# Patient Record
Sex: Female | Born: 1965
Health system: Southern US, Community
[De-identification: ages and names within clinical notes are randomized; demographics above are authoritative.]

## PROBLEM LIST (undated history)

## (undated) DIAGNOSIS — E785 Hyperlipidemia, unspecified: Secondary | ICD-10-CM

## (undated) DIAGNOSIS — R51 Headache: Secondary | ICD-10-CM

## (undated) DIAGNOSIS — E559 Vitamin D deficiency, unspecified: Secondary | ICD-10-CM

## (undated) DIAGNOSIS — E2839 Other primary ovarian failure: Secondary | ICD-10-CM

## (undated) DIAGNOSIS — E039 Hypothyroidism, unspecified: Secondary | ICD-10-CM

## (undated) HISTORY — PX: WISDOM TOOTH EXTRACTION: SHX21

---
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
## 1998-09-28 ENCOUNTER — Other Ambulatory Visit: Admission: RE | Admit: 1998-09-28 | Discharge: 1998-09-28 | Payer: Self-pay | Admitting: Gynecology

## 1999-10-02 ENCOUNTER — Other Ambulatory Visit: Admission: RE | Admit: 1999-10-02 | Discharge: 1999-10-02 | Payer: Self-pay | Admitting: Gynecology

## 2000-05-24 ENCOUNTER — Encounter: Payer: Self-pay | Admitting: Gynecology

## 2000-05-24 ENCOUNTER — Encounter: Admission: RE | Admit: 2000-05-24 | Discharge: 2000-05-24 | Payer: Self-pay | Admitting: Gynecology

## 2000-10-02 ENCOUNTER — Other Ambulatory Visit: Admission: RE | Admit: 2000-10-02 | Discharge: 2000-10-02 | Payer: Self-pay | Admitting: Gynecology

## 2001-09-30 ENCOUNTER — Other Ambulatory Visit: Admission: RE | Admit: 2001-09-30 | Discharge: 2001-09-30 | Payer: Self-pay | Admitting: Gynecology

## 2002-11-04 ENCOUNTER — Other Ambulatory Visit: Admission: RE | Admit: 2002-11-04 | Discharge: 2002-11-04 | Payer: Self-pay | Admitting: Gynecology

## 2003-11-04 ENCOUNTER — Encounter: Admission: RE | Admit: 2003-11-04 | Discharge: 2003-11-04 | Payer: Self-pay | Admitting: Gynecology

## 2003-11-04 ENCOUNTER — Other Ambulatory Visit: Admission: RE | Admit: 2003-11-04 | Discharge: 2003-11-04 | Payer: Self-pay | Admitting: Gynecology

## 2004-11-06 ENCOUNTER — Other Ambulatory Visit: Admission: RE | Admit: 2004-11-06 | Discharge: 2004-11-06 | Payer: Self-pay | Admitting: Gynecology

## 2004-11-27 ENCOUNTER — Encounter: Admission: RE | Admit: 2004-11-27 | Discharge: 2004-11-27 | Payer: Self-pay | Admitting: Gynecology

## 2005-11-13 ENCOUNTER — Other Ambulatory Visit: Admission: RE | Admit: 2005-11-13 | Discharge: 2005-11-13 | Payer: Self-pay | Admitting: Gynecology

## 2005-11-29 ENCOUNTER — Encounter: Admission: RE | Admit: 2005-11-29 | Discharge: 2005-11-29 | Payer: Self-pay | Admitting: Gynecology

## 2006-11-14 ENCOUNTER — Other Ambulatory Visit: Admission: RE | Admit: 2006-11-14 | Discharge: 2006-11-14 | Payer: Self-pay | Admitting: Gynecology

## 2006-12-02 ENCOUNTER — Encounter: Admission: RE | Admit: 2006-12-02 | Discharge: 2006-12-02 | Payer: Self-pay | Admitting: Gynecology

## 2007-07-28 ENCOUNTER — Emergency Department (HOSPITAL_COMMUNITY): Admission: EM | Admit: 2007-07-28 | Discharge: 2007-07-28 | Payer: Self-pay | Admitting: Emergency Medicine

## 2007-11-17 ENCOUNTER — Other Ambulatory Visit: Admission: RE | Admit: 2007-11-17 | Discharge: 2007-11-17 | Payer: Self-pay | Admitting: Gynecology

## 2007-12-05 ENCOUNTER — Encounter: Admission: RE | Admit: 2007-12-05 | Discharge: 2007-12-05 | Payer: Self-pay | Admitting: Gynecology

## 2008-08-11 ENCOUNTER — Encounter (INDEPENDENT_AMBULATORY_CARE_PROVIDER_SITE_OTHER): Payer: Self-pay | Admitting: Interventional Radiology

## 2008-08-11 ENCOUNTER — Encounter: Admission: RE | Admit: 2008-08-11 | Discharge: 2008-08-11 | Payer: Self-pay | Admitting: Internal Medicine

## 2008-08-11 ENCOUNTER — Other Ambulatory Visit: Admission: RE | Admit: 2008-08-11 | Discharge: 2008-08-11 | Payer: Self-pay | Admitting: Interventional Radiology

## 2010-09-17 ENCOUNTER — Encounter: Payer: Self-pay | Admitting: Physical Medicine and Rehabilitation

## 2010-09-27 HISTORY — PX: BREAST SURGERY: SHX581

## 2010-11-01 ENCOUNTER — Other Ambulatory Visit: Payer: Self-pay | Admitting: Plastic Surgery

## 2010-11-01 ENCOUNTER — Ambulatory Visit
Admission: RE | Admit: 2010-11-01 | Discharge: 2010-11-01 | Disposition: A | Discharge status: Home / Self Care | Payer: PRIVATE HEALTH INSURANCE | Source: Ambulatory Visit | Attending: Plastic Surgery | Admitting: Plastic Surgery

## 2010-11-01 DIAGNOSIS — T148XXA Other injury of unspecified body region, initial encounter: Secondary | ICD-10-CM

## 2011-06-04 LAB — DIFFERENTIAL
Basophils Absolute: 0
Basophils Relative: 0
Eosinophils Absolute: 0.1 — ABNORMAL LOW
Eosinophils Relative: 1
Lymphocytes Relative: 31
Lymphs Abs: 2.3
Monocytes Absolute: 0.7
Monocytes Relative: 9
Neutro Abs: 4.2
Neutrophils Relative %: 58

## 2011-06-04 LAB — CBC
HCT: 34.6 — ABNORMAL LOW
Hemoglobin: 12
MCHC: 34.8
MCV: 89.2
Platelets: 231
RBC: 3.88
RDW: 14
WBC: 7.3

## 2011-06-04 LAB — COMPREHENSIVE METABOLIC PANEL
ALT: 22
AST: 28
Albumin: 3.9
Alkaline Phosphatase: 63
BUN: 13
CO2: 27
Calcium: 9.1
Chloride: 98
Creatinine, Ser: 0.71
GFR calc Af Amer: >60
GFR calc non Af Amer: >60
Glucose, Bld: 116 — ABNORMAL HIGH
Potassium: 3.6
Sodium: 134 — ABNORMAL LOW
Total Bilirubin: 0.9
Total Protein: 6.5

## 2011-06-04 LAB — URINE MICROSCOPIC-ADD ON

## 2011-06-04 LAB — LIPASE, BLOOD: Lipase: 24

## 2011-06-04 LAB — URINALYSIS, ROUTINE W REFLEX MICROSCOPIC
Bilirubin Urine: NEGATIVE
Nitrite: NEGATIVE
Specific Gravity, Urine: 1.015
Urobilinogen, UA: 0.2

## 2012-01-17 ENCOUNTER — Ambulatory Visit (HOSPITAL_COMMUNITY): Payer: PRIVATE HEALTH INSURANCE | Admitting: Anesthesiology

## 2012-01-17 ENCOUNTER — Encounter (HOSPITAL_COMMUNITY): Payer: Self-pay | Admitting: *Deleted

## 2012-01-17 ENCOUNTER — Encounter (HOSPITAL_COMMUNITY)
Admission: RE | Disposition: A | Discharge status: Home / Self Care | Payer: Self-pay | Source: Ambulatory Visit | Attending: Obstetrics and Gynecology

## 2012-01-17 ENCOUNTER — Other Ambulatory Visit: Payer: Self-pay | Admitting: Obstetrics and Gynecology

## 2012-01-17 ENCOUNTER — Encounter (HOSPITAL_COMMUNITY): Payer: Self-pay | Admitting: Anesthesiology

## 2012-01-17 ENCOUNTER — Encounter (HOSPITAL_COMMUNITY): Payer: Self-pay | Admitting: Pharmacist

## 2012-01-17 ENCOUNTER — Ambulatory Visit (HOSPITAL_COMMUNITY)
Admission: RE | Admit: 2012-01-17 | Discharge: 2012-01-17 | Disposition: A | Discharge status: Home / Self Care | Payer: PRIVATE HEALTH INSURANCE | Source: Ambulatory Visit | Attending: Obstetrics and Gynecology | Admitting: Obstetrics and Gynecology

## 2012-01-17 DIAGNOSIS — N764 Abscess of vulva: Secondary | ICD-10-CM | POA: Insufficient documentation

## 2012-01-17 HISTORY — DX: Hyperlipidemia, unspecified: E78.5

## 2012-01-17 HISTORY — DX: Hypothyroidism, unspecified: E03.9

## 2012-01-17 HISTORY — PX: VULVAR LESION REMOVAL: SHX5391

## 2012-01-17 HISTORY — DX: Headache: R51

## 2012-01-17 LAB — CBC
Hemoglobin: 13.7 g/dL (ref 12.0–15.0)
MCH: 31.6 pg (ref 26.0–34.0)
MCHC: 34.7 g/dL (ref 30.0–36.0)
RDW: 13.9 % (ref 11.5–15.5)

## 2012-01-17 SURGERY — VULVAR LESION
Anesthesia: General | Site: Perineum | Wound class: Dirty or Infected

## 2012-01-17 MED ORDER — PROMETHAZINE HCL 25 MG/ML IJ SOLN: 6.2500 mg | INTRAMUSCULAR | Status: DC | PRN

## 2012-01-17 MED ORDER — DEXAMETHASONE SODIUM PHOSPHATE 4 MG/ML IJ SOLN
INTRAMUSCULAR | Status: DC | PRN
  Administered 2012-01-17: 10 mg via INTRAVENOUS

## 2012-01-17 MED ORDER — ONDANSETRON HCL 4 MG/2ML IJ SOLN
INTRAMUSCULAR | Status: AC
  Filled 2012-01-17: qty 2

## 2012-01-17 MED ORDER — LIDOCAINE HCL (CARDIAC) 20 MG/ML IV SOLN
INTRAVENOUS | Status: DC | PRN
  Administered 2012-01-17: 100 mg via INTRAVENOUS

## 2012-01-17 MED ORDER — MIDAZOLAM HCL 2 MG/2ML IJ SOLN
INTRAMUSCULAR | Status: AC
  Filled 2012-01-17: qty 2

## 2012-01-17 MED ORDER — KETOROLAC TROMETHAMINE 30 MG/ML IJ SOLN
INTRAMUSCULAR | Status: DC | PRN
  Administered 2012-01-17: 30 mg via INTRAVENOUS

## 2012-01-17 MED ORDER — MIDAZOLAM HCL 5 MG/5ML IJ SOLN
INTRAMUSCULAR | Status: DC | PRN
  Administered 2012-01-17: 2 mg via INTRAVENOUS

## 2012-01-17 MED ORDER — SULFAMETHOXAZOLE-TRIMETHOPRIM 800-160 MG PO TABS: 1.0000 | ORAL_TABLET | Freq: Two times a day (BID) | ORAL | Status: AC

## 2012-01-17 MED ORDER — PROPOFOL 10 MG/ML IV EMUL
INTRAVENOUS | Status: DC | PRN
  Administered 2012-01-17: 200 mg via INTRAVENOUS

## 2012-01-17 MED ORDER — LACTATED RINGERS IV SOLN
INTRAVENOUS | Status: DC
  Administered 2012-01-17: 1000 mL via INTRAVENOUS

## 2012-01-17 MED ORDER — PROPOFOL 10 MG/ML IV EMUL
INTRAVENOUS | Status: AC
  Filled 2012-01-17: qty 20

## 2012-01-17 MED ORDER — KETOROLAC TROMETHAMINE 30 MG/ML IJ SOLN
15.0000 mg | Freq: Once | INTRAMUSCULAR | Status: DC | PRN
Start: 2012-01-17 — End: 2012-01-17

## 2012-01-17 MED ORDER — LIDOCAINE HCL (CARDIAC) 20 MG/ML IV SOLN
INTRAVENOUS | Status: AC
  Filled 2012-01-17: qty 5

## 2012-01-17 MED ORDER — 0.9 % SODIUM CHLORIDE (POUR BTL) OPTIME
TOPICAL | Status: DC | PRN
  Administered 2012-01-17: 1000 mL

## 2012-01-17 MED ORDER — FENTANYL CITRATE 0.05 MG/ML IJ SOLN
INTRAMUSCULAR | Status: DC | PRN
  Administered 2012-01-17: 100 ug via INTRAVENOUS

## 2012-01-17 MED ORDER — OXYCODONE-ACETAMINOPHEN 5-325 MG PO TABS: 2.0000 | ORAL_TABLET | Freq: Four times a day (QID) | ORAL | Status: AC | PRN

## 2012-01-17 MED ORDER — CLINDAMYCIN PHOSPHATE 900 MG/50ML IV SOLN
900.0000 mg | Freq: Once | INTRAVENOUS | Status: AC
  Administered 2012-01-17: 900 mg via INTRAVENOUS
  Filled 2012-01-17: qty 50

## 2012-01-17 MED ORDER — ONDANSETRON HCL 4 MG/2ML IJ SOLN
INTRAMUSCULAR | Status: DC | PRN
  Administered 2012-01-17: 4 mg via INTRAVENOUS

## 2012-01-17 MED ORDER — KETOROLAC TROMETHAMINE 30 MG/ML IJ SOLN
INTRAMUSCULAR | Status: AC
  Filled 2012-01-17: qty 1

## 2012-01-17 MED ORDER — MEPERIDINE HCL 25 MG/ML IJ SOLN: 6.2500 mg | INTRAMUSCULAR | Status: DC | PRN

## 2012-01-17 MED ORDER — FENTANYL CITRATE 0.05 MG/ML IJ SOLN
INTRAMUSCULAR | Status: AC
  Filled 2012-01-17: qty 2

## 2012-01-17 MED ORDER — FENTANYL CITRATE 0.05 MG/ML IJ SOLN: 25.0000 ug | INTRAMUSCULAR | Status: DC | PRN

## 2012-01-17 SURGICAL SUPPLY — 28 items
APPLICATOR COTTON TIP 6IN STRL (MISCELLANEOUS) ×1 IMPLANT
BLADE SURG 15 STRL LF C SS BP (BLADE) ×1 IMPLANT
BLADE SURG 15 STRL SS (BLADE) ×2
CLOTH BEACON ORANGE TIMEOUT ST (SAFETY) ×2 IMPLANT
CONTAINER PREFILL 10% NBF 15ML (MISCELLANEOUS) ×2 IMPLANT
COUNTER NEEDLE 1200 MAGNETIC (NEEDLE) IMPLANT
EVACUATOR PREFILTER SMOKE (MISCELLANEOUS) IMPLANT
GAUZE PACKING IODOFORM 1/2 (PACKING) ×2 IMPLANT
GLOVE BIO SURGEON STRL SZ8 (GLOVE) ×4 IMPLANT
GLOVE INDICATOR 7.0 STRL GRN (GLOVE) ×2 IMPLANT
GOWN PREVENTION PLUS LG XLONG (DISPOSABLE) ×2 IMPLANT
HOSE NS SMOKE EVAC 7/8 X6 (MISCELLANEOUS) IMPLANT
NDL SPNL 22GX3.5 QUINCKE BK (NEEDLE) ×1 IMPLANT
NEEDLE SPNL 22GX3.5 QUINCKE BK (NEEDLE) ×2 IMPLANT
NS IRRIG 1000ML POUR BTL (IV SOLUTION) ×2 IMPLANT
PACK VAGINAL MINOR WOMEN LF (CUSTOM PROCEDURE TRAY) ×2 IMPLANT
PAD PREP 24X48 CUFFED NSTRL (MISCELLANEOUS) ×2 IMPLANT
REDUCER FITTING SMOKE EVAC (MISCELLANEOUS) IMPLANT
SCOPETTES 8  STERILE (MISCELLANEOUS) ×3
SCOPETTES 8 STERILE (MISCELLANEOUS) ×3 IMPLANT
SUT VIC AB 3-0 PS2 18 (SUTURE)
SUT VIC AB 3-0 PS2 18XBRD (SUTURE) IMPLANT
SYR BULB IRRIGATION 50ML (SYRINGE) ×1 IMPLANT
SYR CONTROL 10ML LL (SYRINGE) ×2 IMPLANT
TOWEL OR 17X24 6PK STRL BLUE (TOWEL DISPOSABLE) ×4 IMPLANT
TUBING CONNECTING 10 (TUBING) ×1 IMPLANT
WATER STERILE IRR 1000ML POUR (IV SOLUTION) ×2 IMPLANT
YANKAUER SUCT BULB TIP NO VENT (SUCTIONS) ×1 IMPLANT

## 2012-01-17 NOTE — Progress Notes (Signed)
Reviewed with patient / husband procedure Risks including recurrence, , pelvic pain, dyspareunia, colon involvement All questions answered.

## 2012-01-17 NOTE — Anesthesia Postprocedure Evaluation (Signed)
Anesthesia Post Note  Patient: Kathleen Luna  Procedure(s) Performed: Procedure(s) (LRB): VULVAR LESION (N/A)  Anesthesia type: General  Patient location: PACU  Post pain: Pain level controlled  Post assessment: Post-op Vital signs reviewed  Last Vitals:  Filed Vitals:   01/17/12 1700  BP: 124/79  Pulse: 86  Temp: 37.1 C  Resp: 26    Post vital signs: Reviewed  Level of consciousness: sedated  Complications: No apparent anesthesia complicationsfj

## 2012-01-17 NOTE — Transfer of Care (Signed)
Immediate Anesthesia Transfer of Care Note  Patient: Kathleen Luna  Procedure(s) Performed: Procedure(s) (LRB): VULVAR LESION (N/A)  Patient Location: PACU  Anesthesia Type: General  Level of Consciousness: awake, alert  and oriented  Airway & Oxygen Therapy: Patient Spontanous Breathing  Post-op Assessment: Report given to PACU RN  Post vital signs: Reviewed and stable  Complications: No apparent anesthesia complications

## 2012-01-17 NOTE — Anesthesia Procedure Notes (Signed)
Procedure Name: LMA Insertion Date/Time: 01/17/2012 4:17 PM Performed by: Babatunde Seago, Jannet Askew Pre-anesthesia Checklist: Patient identified, Patient being monitored, Emergency Drugs available and Timeout performed Patient Re-evaluated:Patient Re-evaluated prior to inductionOxygen Delivery Method: Circle system utilized Preoxygenation: Pre-oxygenation with 100% oxygen Intubation Type: IV induction LMA: LMA inserted LMA Size: 4.0 Grade View: Grade I Number of attempts: 1

## 2012-01-17 NOTE — Anesthesia Preprocedure Evaluation (Signed)
Anesthesia Evaluation  Patient identified by MRN, date of birth, ID band Patient awake    Reviewed: Allergy & Precautions, H&P , NPO status , Patient's Chart, lab work & pertinent test results  Airway Mallampati: I TM Distance: >3 FB Neck ROM: full    Dental No notable dental hx. (+) Teeth Intact   Pulmonary neg pulmonary ROS,    Pulmonary exam normal       Cardiovascular negative cardio ROS      Neuro/Psych negative psych ROS   GI/Hepatic negative GI ROS, Neg liver ROS,   Endo/Other    Renal/GU negative Renal ROS  negative genitourinary   Musculoskeletal negative musculoskeletal ROS (+)   Abdominal Normal abdominal exam  (+)   Peds negative pediatric ROS (+)  Hematology negative hematology ROS (+)   Anesthesia Other Findings   Reproductive/Obstetrics negative OB ROS                           Anesthesia Physical Anesthesia Plan  ASA: II  Anesthesia Plan: General   Post-op Pain Management:    Induction: Intravenous  Airway Management Planned: LMA  Additional Equipment:   Intra-op Plan:   Post-operative Plan:   Informed Consent: I have reviewed the patients History and Physical, chart, labs and discussed the procedure including the risks, benefits and alternatives for the proposed anesthesia with the patient or authorized representative who has indicated his/her understanding and acceptance.     Plan Discussed with: CRNA and Surgeon  Anesthesia Plan Comments:         Anesthesia Quick Evaluation

## 2012-01-17 NOTE — H&P (Signed)
Kathleen Luna is an 46 y.o. female with abcess of vulva noted in office yesterday. Patient placed on Keflex.  Today has more pain. Unable to drain in office due to patient discomfort.  Patient reports low grade temperature elevations. Pertinent Gynecological History: Menses: amenorrheic on Mirena Bleeding: none Contraception: IUD DES exposure: unknown Blood transfusions: none Sexually transmitted diseases: HSV Previous GYN Procedures: none  Last mammogram: normal Date: 2013 Last pap: normal Date: 2013 OB History: G1, P1   Menstrual History: Menarche age: unknown No LMP recorded. Patient is not currently having periods (Reason: IUD).    Past Medical History  Diagnosis Date  . SVD (spontaneous vaginal delivery)     x 1  . Hyperlipidemia   . Hypothyroidism   . Headache     otc med prn    Past Surgical History  Procedure Date  . Breast surgery 09/2010    augmentation  . Wisdom tooth extraction     History reviewed. No pertinent family history.  Social History:  reports that she quit smoking about 14 years ago. Her smoking use included Cigarettes. She has a 7.5 pack-year smoking history. She has never used smokeless tobacco. She reports that she drinks alcohol. She reports that she does not use illicit drugs.  Allergies: No Known Allergies  No prescriptions prior to admission    ROS  Height 5\' 4"  (1.626 m), weight 70.308 kg (155 lb). Physical Exam  Cardiovascular: Normal rate and regular rhythm.   Respiratory: Effort normal and breath sounds normal.  Genitourinary:       Fluctuant abscess of vulva over upper perineal body    No results found for this or any previous visit (from the past 24 hour(s)).  No results found.  Assessment/Plan: 46 yo G1P1 with vulvar abcess for I&D.  Brittay Mogle II,Jame Morrell E 01/17/2012, 12:50 PM

## 2012-01-17 NOTE — Brief Op Note (Signed)
01/17/2012  4:42 PM  PATIENT:  Bolivar Haw  46 y.o. female  PRE-OPERATIVE DIAGNOSIS:  perineum abscess  POST-OPERATIVE DIAGNOSIS:  perineal abcess  PROCEDURE:  Procedure(s) (LRB):Incision and Drainage of VULVAR LESION (N/A)  SURGEON:  Surgeon(s) and Role:    * Leslie Andrea, MD - Primary  PHYSICIAN ASSISTANT:   ASSISTANTS: none   ANESTHESIA:   general  EBL:  Total I/O In: 1000 [I.V.:1000] Out: 50 [Urine:50]  BLOOD ADMINISTERED:none  DRAINS: none   LOCAL MEDICATIONS USED:  NONE  SPECIMEN:  Source of Specimen:  aerobic and anaerobic cultures  DISPOSITION OF SPECIMEN:  PATHOLOGY  COUNTS:  YES  TOURNIQUET:  * No tourniquets in log *  DICTATION: .Other Dictation: Dictation Number [id number]  PLAN OF CARE: Discharge to home after PACU  PATIENT DISPOSITION:  PACU - hemodynamically stable.   Delay start of Pharmacological VTE agent (>24hrs) due to surgical blood loss or risk of bleeding: not applicable

## 2012-01-18 NOTE — Op Note (Signed)
NAMESRITHA, CHAUNCEY                ACCOUNT NO.:  [id number]  MEDICAL RECORD NO.:  [id number]  LOCATION:  [facility]                          FACILITY:  [facility]  PHYSICIAN:  Guy Sandifer. Henderson Cloud, M.D. DATE OF BIRTH:  1966/06/19  DATE OF PROCEDURE:  01/17/2012 DATE OF DISCHARGE:  01/17/2012                              OPERATIVE REPORT   PREOPERATIVE DIAGNOSIS:  Vulvar abscess.  POSTOPERATIVE DIAGNOSIS:  Vulvar abscess.  PROCEDURE:  Incision and drainage of vulvar abscess.  SURGEON:  Guy Sandifer. Henderson Cloud, M.D.  ANESTHESIA:  General with LMA, Leilani Able, MD  ESTIMATED BLOOD LOSS:  Drops.  SPECIMENS:  Aerobic and anaerobic cultures to pathology.  INDICATIONS AND CONSENT:  This patient is a 46 year old married white female, with a Mirena IUD in place, who presents to the office yesterday with a small abscess over the perineal body.  She is placed on Ceftin. She comes back today with complaints of some body aches, but no fever. On exam, she has a very fluctuant abscess over the perineal body. Incision and drainage is recommended.  During the period of time from the office visit until her presentation to the operating room, the lesion began to drain spontaneously.  Potential risks and complications were discussed with the patient and her husband preoperatively including not limited to, further spreading, infection, recurrence of the abscess, fistula formation, pelvic pain, pain with intercourse, DVT, PE, pneumonia and organ damage.  All questions were answered and consent is signed on the chart.  FINDINGS:  There is a deflated abscess at the 6 o'clock position extending from the vaginal introitus down to the anus.  It appears to have drained just at the margin of the anus.  On examination both rectal and vaginal, there is induration in a butterfly pattern bilaterally that does not appear to extend beyond the perineum.  PROCEDURE:  The patient was taken to the operating room, where she  was identified, placed in dorsal supine position and general anesthesia was induced via LMA.  She was placed in the dorsal lithotomy position.  Time- out was undertaken.  She was prepped with Betadine.  Bladder straight catheterized and she was draped in a sterile fashion.  After noting the above, the incision is made on the perineum in the center of the abscess capsule.  Very little purulent material was noted.  Cultures were taken both aerobic and anaerobic.  The area was explored bilaterally thoroughly and no additional purulence was noted.  A 1-1/2 inch iodoform gauze packing was placed in the incision.  All counts were correct.  The patient taken to the recovery room in stable condition.     Guy Sandifer Henderson Cloud, M.D.     JET/MEDQ  D:  01/17/2012  T:  01/18/2012  Job:  [id number]

## 2012-01-20 LAB — CULTURE, ROUTINE-ABSCESS

## 2012-01-24 LAB — ANAEROBIC CULTURE

## 2012-03-21 ENCOUNTER — Encounter (HOSPITAL_COMMUNITY): Payer: Self-pay | Admitting: Anesthesiology

## 2012-03-21 ENCOUNTER — Encounter (HOSPITAL_COMMUNITY)
Admission: AD | Disposition: A | Discharge status: Home / Self Care | Payer: Self-pay | Source: Ambulatory Visit | Attending: Obstetrics and Gynecology

## 2012-03-21 ENCOUNTER — Encounter (HOSPITAL_COMMUNITY): Payer: Self-pay | Admitting: *Deleted

## 2012-03-21 ENCOUNTER — Inpatient Hospital Stay (HOSPITAL_COMMUNITY)
Admission: AD | Admit: 2012-03-21 | Discharge: 2012-03-21 | Disposition: A | Discharge status: Home / Self Care | Payer: No Typology Code available for payment source | Source: Ambulatory Visit | Attending: Obstetrics and Gynecology | Admitting: Obstetrics and Gynecology

## 2012-03-21 ENCOUNTER — Encounter (HOSPITAL_COMMUNITY): Admission: AD | Payer: Self-pay | Source: Ambulatory Visit

## 2012-03-21 ENCOUNTER — Ambulatory Visit (HOSPITAL_COMMUNITY)
Admission: AD | Admit: 2012-03-21 | Payer: PRIVATE HEALTH INSURANCE | Source: Ambulatory Visit | Admitting: Obstetrics and Gynecology

## 2012-03-21 DIAGNOSIS — N764 Abscess of vulva: Secondary | ICD-10-CM | POA: Insufficient documentation

## 2012-03-21 LAB — URINALYSIS, ROUTINE W REFLEX MICROSCOPIC
Bilirubin Urine: NEGATIVE
Glucose, UA: NEGATIVE mg/dL
Ketones, ur: NEGATIVE mg/dL
Leukocytes, UA: NEGATIVE
pH: 6.5 (ref 5.0–8.0)

## 2012-03-21 LAB — URINE MICROSCOPIC-ADD ON

## 2012-03-21 SURGERY — VULVAR LESION
Anesthesia: Choice

## 2012-03-21 MED ORDER — OXYCODONE-ACETAMINOPHEN 5-325 MG PO TABS: 2.0000 | ORAL_TABLET | Freq: Four times a day (QID) | ORAL | Status: AC | PRN

## 2012-03-21 MED ORDER — CIPROFLOXACIN HCL 500 MG PO TABS: 500.0000 mg | ORAL_TABLET | Freq: Two times a day (BID) | ORAL | Status: AC

## 2012-03-21 SURGICAL SUPPLY — 22 items
BLADE SURG 15 STRL LF C SS BP (BLADE) ×1 IMPLANT
BLADE SURG 15 STRL SS (BLADE)
CLOTH BEACON ORANGE TIMEOUT ST (SAFETY) ×1 IMPLANT
CONTAINER PREFILL 10% NBF 15ML (MISCELLANEOUS) ×1 IMPLANT
COUNTER NEEDLE 1200 MAGNETIC (NEEDLE) IMPLANT
EVACUATOR PREFILTER SMOKE (MISCELLANEOUS) IMPLANT
GLOVE BIO SURGEON STRL SZ8 (GLOVE) ×2 IMPLANT
GOWN PREVENTION PLUS LG XLONG (DISPOSABLE) ×1 IMPLANT
HOSE NS SMOKE EVAC 7/8 X6 (MISCELLANEOUS) IMPLANT
NDL SPNL 22GX3.5 QUINCKE BK (NEEDLE) ×1 IMPLANT
NEEDLE SPNL 22GX3.5 QUINCKE BK (NEEDLE) IMPLANT
NS IRRIG 1000ML POUR BTL (IV SOLUTION) ×1 IMPLANT
PACK VAGINAL MINOR WOMEN LF (CUSTOM PROCEDURE TRAY) ×1 IMPLANT
PAD PREP 24X48 CUFFED NSTRL (MISCELLANEOUS) ×1 IMPLANT
REDUCER FITTING SMOKE EVAC (MISCELLANEOUS) IMPLANT
SCOPETTES 8  STERILE (MISCELLANEOUS)
SCOPETTES 8 STERILE (MISCELLANEOUS) ×3 IMPLANT
SUT VIC AB 3-0 PS2 18 (SUTURE)
SUT VIC AB 3-0 PS2 18XBRD (SUTURE) IMPLANT
SYR CONTROL 10ML LL (SYRINGE) ×1 IMPLANT
TOWEL OR 17X24 6PK STRL BLUE (TOWEL DISPOSABLE) ×2 IMPLANT
WATER STERILE IRR 1000ML POUR (IV SOLUTION) ×1 IMPLANT

## 2012-03-21 NOTE — Progress Notes (Signed)
46 yo with Mirena IUD had vulvar abcess in May of this year.  Cultures positive for E. Coli. Patient was taken to OR for I&D, abscess explored and packed and it healed well.  Had Mirena IUD replaced 4 days ago.  The next day had pain and recurrence of abscess.  Was placed on Keflex.  Abscess grew and became very painful, unable to sit. Became achy but afebrile in office.  Sent to [facility] for probable I&D with general surgery consultation.  Blood pressure 112/71, pulse 74, temperature 98.4 F (36.9 C), temperature source Oral, resp. rate 18, height 5\' 4"  (1.626 m), weight 69.4 kg (153 lb), SpO2 98.00%.  Patient uncomfortable in office, unable to sit. Skin W&D  On way to hospital, abcess spontaneously drained.  Now feeling much better. On exam, abscess pointing over perineal body. Copious purulent drainage expressed, culture done.  A: Recurrent vulvar abcess  P: cipro 500 mg, #28, 1 po bid      FU in office next week

## 2012-03-24 ENCOUNTER — Encounter (HOSPITAL_COMMUNITY): Payer: Self-pay | Admitting: Obstetrics and Gynecology

## 2012-03-24 LAB — WOUND CULTURE: Gram Stain: NONE SEEN

## 2012-03-25 ENCOUNTER — Other Ambulatory Visit (HOSPITAL_COMMUNITY): Payer: Self-pay | Admitting: Obstetrics and Gynecology

## 2012-03-25 DIAGNOSIS — L0390 Cellulitis, unspecified: Secondary | ICD-10-CM

## 2012-03-25 DIAGNOSIS — L0291 Cutaneous abscess, unspecified: Secondary | ICD-10-CM

## 2012-03-26 ENCOUNTER — Ambulatory Visit (HOSPITAL_COMMUNITY): Payer: PRIVATE HEALTH INSURANCE

## 2012-03-27 ENCOUNTER — Ambulatory Visit (HOSPITAL_COMMUNITY)
Admission: RE | Admit: 2012-03-27 | Discharge: 2012-03-27 | Disposition: A | Discharge status: Home / Self Care | Payer: No Typology Code available for payment source | Source: Ambulatory Visit | Attending: Obstetrics and Gynecology | Admitting: Obstetrics and Gynecology

## 2012-03-27 DIAGNOSIS — L0390 Cellulitis, unspecified: Secondary | ICD-10-CM

## 2012-03-27 DIAGNOSIS — L0291 Cutaneous abscess, unspecified: Secondary | ICD-10-CM

## 2012-03-27 DIAGNOSIS — Z30431 Encounter for routine checking of intrauterine contraceptive device: Secondary | ICD-10-CM | POA: Insufficient documentation

## 2012-03-27 DIAGNOSIS — K5730 Diverticulosis of large intestine without perforation or abscess without bleeding: Secondary | ICD-10-CM | POA: Insufficient documentation

## 2012-03-27 DIAGNOSIS — J8410 Pulmonary fibrosis, unspecified: Secondary | ICD-10-CM | POA: Insufficient documentation

## 2012-03-27 DIAGNOSIS — L02219 Cutaneous abscess of trunk, unspecified: Secondary | ICD-10-CM | POA: Insufficient documentation

## 2012-03-27 MED ORDER — IOHEXOL 300 MG/ML  SOLN
100.0000 mL | Freq: Once | INTRAMUSCULAR | Status: AC | PRN
  Administered 2012-03-27: 100 mL via INTRAVENOUS

## 2012-04-29 ENCOUNTER — Ambulatory Visit: Payer: PRIVATE HEALTH INSURANCE | Admitting: Internal Medicine

## 2012-05-14 ENCOUNTER — Other Ambulatory Visit: Payer: Self-pay | Admitting: Gastroenterology

## 2012-09-11 ENCOUNTER — Other Ambulatory Visit: Payer: Self-pay | Admitting: Physician Assistant

## 2013-03-26 ENCOUNTER — Other Ambulatory Visit: Payer: Self-pay | Admitting: Gynecology

## 2013-03-26 DIAGNOSIS — R928 Other abnormal and inconclusive findings on diagnostic imaging of breast: Secondary | ICD-10-CM

## 2013-04-02 ENCOUNTER — Ambulatory Visit
Admission: RE | Admit: 2013-04-02 | Discharge: 2013-04-02 | Disposition: A | Discharge status: Home / Self Care | Payer: No Typology Code available for payment source | Source: Ambulatory Visit | Attending: Gynecology | Admitting: Gynecology

## 2013-04-02 DIAGNOSIS — R928 Other abnormal and inconclusive findings on diagnostic imaging of breast: Secondary | ICD-10-CM

## 2013-04-03 ENCOUNTER — Other Ambulatory Visit: Payer: PRIVATE HEALTH INSURANCE

## 2013-09-03 ENCOUNTER — Encounter (HOSPITAL_COMMUNITY): Payer: Self-pay | Admitting: Respiratory Therapy

## 2013-09-04 ENCOUNTER — Encounter (INDEPENDENT_AMBULATORY_CARE_PROVIDER_SITE_OTHER): Payer: Self-pay

## 2013-09-04 ENCOUNTER — Encounter (HOSPITAL_COMMUNITY)
Admission: RE | Admit: 2013-09-04 | Discharge: 2013-09-04 | Disposition: A | Discharge status: Home / Self Care | Payer: No Typology Code available for payment source | Source: Ambulatory Visit | Attending: Orthopedic Surgery | Admitting: Orthopedic Surgery

## 2013-09-04 ENCOUNTER — Encounter (HOSPITAL_COMMUNITY): Payer: Self-pay

## 2013-09-04 DIAGNOSIS — Z01818 Encounter for other preprocedural examination: Secondary | ICD-10-CM | POA: Insufficient documentation

## 2013-09-04 DIAGNOSIS — Z01812 Encounter for preprocedural laboratory examination: Secondary | ICD-10-CM | POA: Insufficient documentation

## 2013-09-04 LAB — CBC
HCT: 39.6 % (ref 36.0–46.0)
Hemoglobin: 13.9 g/dL (ref 12.0–15.0)
MCH: 31.3 pg (ref 26.0–34.0)
MCHC: 35.1 g/dL (ref 30.0–36.0)
MCV: 89.2 fL (ref 78.0–100.0)
PLATELETS: 339 10*3/uL (ref 150–400)
RBC: 4.44 MIL/uL (ref 3.87–5.11)
RDW: 14.3 % (ref 11.5–15.5)
WBC: 13 10*3/uL — AB (ref 4.0–10.5)

## 2013-09-04 LAB — BASIC METABOLIC PANEL
BUN: 13 mg/dL (ref 6–23)
CALCIUM: 10 mg/dL (ref 8.4–10.5)
CO2: 29 meq/L (ref 19–32)
Chloride: 98 mEq/L (ref 96–112)
Creatinine, Ser: 0.55 mg/dL (ref 0.50–1.10)
GFR calc Af Amer: >90 mL/min (ref >90)
GFR calc non Af Amer: >90 mL/min (ref >90)
Glucose, Bld: 86 mg/dL (ref 70–99)
POTASSIUM: 4.1 meq/L (ref 3.7–5.3)
SODIUM: 139 meq/L (ref 137–147)

## 2013-09-04 NOTE — Pre-Procedure Instructions (Signed)
Kathleen Luna  09/04/2013   Your procedure is scheduled on: Thursday, Jan. 15th             [facility] Parking is available)   Report to [facility]  2 * 3 at  12:30 PM.  Call this number if you have problems the morning of surgery: [phone]   Remember:   Do not eat food or drink liquids after midnight Wednesday.   Take these medicines the morning of surgery with A SIP OF WATER: Lexapro, Levothyroxine   Do not wear jewelry, make-up or nail polish.  Do not wear lotions, powders, or perfumes. You may wear deodorant.  Do not shave underarms & legs 48 hours prior to surgery.    Do not bring valuables to the hospital.  [facility] is not responsible for any belongings or valuables.               Contacts, dentures or bridgework may not be worn into surgery.  Leave suitcase in the car. After surgery it may be brought to your room.  For patients admitted to the hospital, discharge time is determined by your treatment team.               Name and phone number of your driver:   Kathleen Luna   Mother in [place]   Special Instructions: Shower using CHG 2 nights before surgery and the night before surgery.  If you shower the day of surgery use CHG.  Use special wash - you have one bottle of CHG for all showers.  You should use approximately 1/3 of the bottle for each shower.   Please read over the following fact sheets that you were given: Pain Booklet, MRSA Information and Surgical Site Infection Prevention

## 2013-09-04 NOTE — Progress Notes (Signed)
Patient stated she had chemical stress test 15-20 yrs ago mainly d/t family history.  Test came out neg.  No problems since.  DA

## 2013-09-09 MED ORDER — CEFAZOLIN SODIUM-DEXTROSE 2-3 GM-% IV SOLR
2.0000 g | INTRAVENOUS | Status: AC
  Administered 2013-09-10: 2 g via INTRAVENOUS

## 2013-09-09 NOTE — Progress Notes (Signed)
Nurse called patient and instructed her to arrive at 1030 instead of 1230. Patient was pleased and verbalized understanding.

## 2013-09-10 ENCOUNTER — Ambulatory Visit (HOSPITAL_COMMUNITY): Payer: No Typology Code available for payment source | Admitting: Anesthesiology

## 2013-09-10 ENCOUNTER — Encounter (HOSPITAL_COMMUNITY): Payer: Self-pay | Admitting: *Deleted

## 2013-09-10 ENCOUNTER — Encounter (HOSPITAL_COMMUNITY): Payer: No Typology Code available for payment source | Admitting: Anesthesiology

## 2013-09-10 ENCOUNTER — Ambulatory Visit (HOSPITAL_COMMUNITY)
Admission: RE | Admit: 2013-09-10 | Discharge: 2013-09-10 | Disposition: A | Discharge status: Home / Self Care | Payer: No Typology Code available for payment source | Source: Ambulatory Visit | Attending: Orthopedic Surgery | Admitting: Orthopedic Surgery

## 2013-09-10 ENCOUNTER — Encounter (HOSPITAL_COMMUNITY)
Admission: RE | Disposition: A | Discharge status: Home / Self Care | Payer: Self-pay | Source: Ambulatory Visit | Attending: Orthopedic Surgery

## 2013-09-10 DIAGNOSIS — E785 Hyperlipidemia, unspecified: Secondary | ICD-10-CM | POA: Insufficient documentation

## 2013-09-10 DIAGNOSIS — E039 Hypothyroidism, unspecified: Secondary | ICD-10-CM | POA: Insufficient documentation

## 2013-09-10 DIAGNOSIS — Z87891 Personal history of nicotine dependence: Secondary | ICD-10-CM | POA: Insufficient documentation

## 2013-09-10 DIAGNOSIS — Z7982 Long term (current) use of aspirin: Secondary | ICD-10-CM | POA: Insufficient documentation

## 2013-09-10 DIAGNOSIS — M75120 Complete rotator cuff tear or rupture of unspecified shoulder, not specified as traumatic: Secondary | ICD-10-CM | POA: Insufficient documentation

## 2013-09-10 DIAGNOSIS — R51 Headache: Secondary | ICD-10-CM | POA: Insufficient documentation

## 2013-09-10 DIAGNOSIS — M19019 Primary osteoarthritis, unspecified shoulder: Secondary | ICD-10-CM | POA: Insufficient documentation

## 2013-09-10 DIAGNOSIS — M7580 Other shoulder lesions, unspecified shoulder: Principal | ICD-10-CM

## 2013-09-10 DIAGNOSIS — M24119 Other articular cartilage disorders, unspecified shoulder: Secondary | ICD-10-CM | POA: Insufficient documentation

## 2013-09-10 DIAGNOSIS — M25819 Other specified joint disorders, unspecified shoulder: Secondary | ICD-10-CM | POA: Insufficient documentation

## 2013-09-10 SURGERY — SHOULDER ARTHROSCOPY WITH SUBACROMIAL DECOMPRESSION AND DISTAL CLAVICLE EXCISION
Anesthesia: General | Site: Shoulder | Laterality: Right

## 2013-09-10 MED ORDER — ACETAMINOPHEN 325 MG PO TABS
650.0000 mg | ORAL_TABLET | Freq: Four times a day (QID) | ORAL | Status: DC | PRN
  Administered 2013-09-10: 650 mg via ORAL

## 2013-09-10 MED ORDER — ONDANSETRON HCL 4 MG/2ML IJ SOLN: 4.0000 mg | Freq: Once | INTRAMUSCULAR | Status: DC | PRN

## 2013-09-10 MED ORDER — ONDANSETRON HCL 4 MG/2ML IJ SOLN
INTRAMUSCULAR | Status: DC | PRN
  Administered 2013-09-10: 4 mg via INTRAVENOUS

## 2013-09-10 MED ORDER — ROCURONIUM BROMIDE 100 MG/10ML IV SOLN
INTRAVENOUS | Status: DC | PRN
  Administered 2013-09-10: 30 mg via INTRAVENOUS

## 2013-09-10 MED ORDER — CEFAZOLIN SODIUM-DEXTROSE 2-3 GM-% IV SOLR
INTRAVENOUS | Status: AC
  Filled 2013-09-10: qty 50

## 2013-09-10 MED ORDER — LACTATED RINGERS IV SOLN
INTRAVENOUS | Status: DC
  Administered 2013-09-10: 11:00:00 via INTRAVENOUS

## 2013-09-10 MED ORDER — MIDAZOLAM HCL 5 MG/5ML IJ SOLN
INTRAMUSCULAR | Status: DC | PRN
  Administered 2013-09-10: 1 mg via INTRAVENOUS

## 2013-09-10 MED ORDER — HYDROMORPHONE HCL PF 1 MG/ML IJ SOLN: 0.2500 mg | INTRAMUSCULAR | Status: DC | PRN

## 2013-09-10 MED ORDER — FENTANYL CITRATE 0.05 MG/ML IJ SOLN
50.0000 ug | INTRAMUSCULAR | Status: DC | PRN
  Administered 2013-09-10: 100 ug via INTRAVENOUS
  Filled 2013-09-10: qty 2

## 2013-09-10 MED ORDER — LIDOCAINE HCL (CARDIAC) 20 MG/ML IV SOLN
INTRAVENOUS | Status: DC | PRN
  Administered 2013-09-10: 40 mg via INTRAVENOUS

## 2013-09-10 MED ORDER — SODIUM CHLORIDE 0.9 % IR SOLN
Status: DC | PRN
  Administered 2013-09-10: 8000 mL

## 2013-09-10 MED ORDER — DEXAMETHASONE SODIUM PHOSPHATE 4 MG/ML IJ SOLN
INTRAMUSCULAR | Status: DC | PRN
  Administered 2013-09-10: 8 mg via INTRAVENOUS

## 2013-09-10 MED ORDER — PROPOFOL 10 MG/ML IV BOLUS
INTRAVENOUS | Status: DC | PRN
  Administered 2013-09-10: 170 mg via INTRAVENOUS
  Administered 2013-09-10: 30 mg via INTRAVENOUS

## 2013-09-10 MED ORDER — FENTANYL CITRATE 0.05 MG/ML IJ SOLN
INTRAMUSCULAR | Status: DC | PRN
  Administered 2013-09-10: 50 ug via INTRAVENOUS

## 2013-09-10 MED ORDER — ARTIFICIAL TEARS OP OINT
TOPICAL_OINTMENT | OPHTHALMIC | Status: DC | PRN
  Administered 2013-09-10: 1 via OPHTHALMIC

## 2013-09-10 MED ORDER — ACETAMINOPHEN 325 MG PO TABS
ORAL_TABLET | ORAL | Status: AC
  Filled 2013-09-10: qty 2

## 2013-09-10 MED ORDER — MIDAZOLAM HCL 2 MG/2ML IJ SOLN: 1.0000 mg | INTRAMUSCULAR | Status: DC | PRN

## 2013-09-10 MED ORDER — GLYCOPYRROLATE 0.2 MG/ML IJ SOLN
INTRAMUSCULAR | Status: DC | PRN
  Administered 2013-09-10: 0.3 mg via INTRAVENOUS

## 2013-09-10 MED ORDER — NEOSTIGMINE METHYLSULFATE 1 MG/ML IJ SOLN
INTRAMUSCULAR | Status: DC | PRN
  Administered 2013-09-10: 2 mg via INTRAVENOUS

## 2013-09-10 MED ORDER — LACTATED RINGERS IV SOLN
INTRAVENOUS | Status: DC | PRN
  Administered 2013-09-10 (×2): via INTRAVENOUS

## 2013-09-10 SURGICAL SUPPLY — 64 items
ANCH SUT 2 CRKSRW FT 14X4.5 (Anchor) ×1 IMPLANT
ANCH SUT SWLK 19.1X4.75 VT (Anchor) ×2 IMPLANT
ANCHOR PEEK 4.75X19.1 SWLK C (Anchor) ×2 IMPLANT
ANCHOR PEEK CORKSCREW 4.5 (Anchor) ×1 IMPLANT
BLADE CUTTER GATOR 3.5 (BLADE) ×2 IMPLANT
BLADE GREAT WHITE 4.2 (BLADE) ×2 IMPLANT
BLADE SURG 11 STRL SS (BLADE) ×2 IMPLANT
BOOTCOVER CLEANROOM LRG (PROTECTIVE WEAR) ×4 IMPLANT
BUR 3.5 LG SPHERICAL (BURR) IMPLANT
BUR OVAL 4.0 (BURR) ×2 IMPLANT
BURR 3.5 LG SPHERICAL (BURR)
CANISTER SUCT LVC 12 LTR MEDI- (MISCELLANEOUS) ×2 IMPLANT
CANNULA ACUFLEX KIT 5X76 (CANNULA) ×2 IMPLANT
CANNULA DRILOCK 5.0X75 (CANNULA) IMPLANT
CLOTH BEACON ORANGE TIMEOUT ST (SAFETY) ×1 IMPLANT
CLSR STERI-STRIP ANTIMIC 1/2X4 (GAUZE/BANDAGES/DRESSINGS) ×1 IMPLANT
CONNECTOR 5 IN 1 STRAIGHT STRL (MISCELLANEOUS) ×2 IMPLANT
DRAPE INCISE 23X17 IOBAN STRL (DRAPES) ×1
DRAPE INCISE 23X17 STRL (DRAPES) ×1 IMPLANT
DRAPE INCISE IOBAN 23X17 STRL (DRAPES) ×1 IMPLANT
DRAPE INCISE IOBAN 66X45 STRL (DRAPES) ×2 IMPLANT
DRAPE STERI 35X30 U-POUCH (DRAPES) ×2 IMPLANT
DRAPE SURG 17X11 SM STRL (DRAPES) ×2 IMPLANT
DRAPE U-SHAPE 47X51 STRL (DRAPES) ×2 IMPLANT
DRSG PAD ABDOMINAL 8X10 ST (GAUZE/BANDAGES/DRESSINGS) ×4 IMPLANT
DURAPREP 26ML APPLICATOR (WOUND CARE) ×4 IMPLANT
ELECT REM PT RETURN 9FT ADLT (ELECTROSURGICAL) ×2
ELECTRODE REM PT RTRN 9FT ADLT (ELECTROSURGICAL) ×1 IMPLANT
GLOVE BIO SURGEON STRL SZ7.5 (GLOVE) ×2 IMPLANT
GLOVE BIO SURGEON STRL SZ8 (GLOVE) ×2 IMPLANT
GLOVE EUDERMIC 7 POWDERFREE (GLOVE) ×2 IMPLANT
GLOVE SS BIOGEL STRL SZ 7.5 (GLOVE) ×1 IMPLANT
GLOVE SUPERSENSE BIOGEL SZ 7.5 (GLOVE) ×1
GOWN STRL NON-REIN LRG LVL3 (GOWN DISPOSABLE) ×2 IMPLANT
GOWN STRL REIN XL XLG (GOWN DISPOSABLE) ×8 IMPLANT
KIT BASIN OR (CUSTOM PROCEDURE TRAY) ×2 IMPLANT
KIT ROOM TURNOVER OR (KITS) ×2 IMPLANT
KIT SHOULDER TRACTION (DRAPES) ×2 IMPLANT
MANIFOLD NEPTUNE II (INSTRUMENTS) ×2 IMPLANT
NDL SPNL 18GX3.5 QUINCKE PK (NEEDLE) ×1 IMPLANT
NDL SUT 6 .5 CRC .975X.05 MAYO (NEEDLE) IMPLANT
NEEDLE MAYO TAPER (NEEDLE)
NEEDLE SPNL 18GX3.5 QUINCKE PK (NEEDLE) ×2 IMPLANT
NS IRRIG 1000ML POUR BTL (IV SOLUTION) ×2 IMPLANT
PACK SHOULDER (CUSTOM PROCEDURE TRAY) ×2 IMPLANT
PAD ABD 8X10 STRL (GAUZE/BANDAGES/DRESSINGS) ×1 IMPLANT
PAD ARMBOARD 7.5X6 YLW CONV (MISCELLANEOUS) ×4 IMPLANT
SET ARTHROSCOPY TUBING (MISCELLANEOUS) ×2
SET ARTHROSCOPY TUBING LN (MISCELLANEOUS) ×1 IMPLANT
SLING ARM FOAM STRAP LRG (SOFTGOODS) IMPLANT
SLING ARM FOAM STRAP MED (SOFTGOODS) ×2 IMPLANT
SPONGE GAUZE 4X4 12PLY (GAUZE/BANDAGES/DRESSINGS) ×2 IMPLANT
SPONGE LAP 4X18 X RAY DECT (DISPOSABLE) ×2 IMPLANT
STRIP CLOSURE SKIN 1/2X4 (GAUZE/BANDAGES/DRESSINGS) ×2 IMPLANT
SUT MNCRL AB 3-0 PS2 18 (SUTURE) ×2 IMPLANT
SUT PDS AB 0 CT 36 (SUTURE) IMPLANT
SUT RETRIEVER GRASP 30 DEG (SUTURE) ×1 IMPLANT
SYR 20CC LL (SYRINGE) ×2 IMPLANT
TAPE CLOTH 4X10 WHT NS (GAUZE/BANDAGES/DRESSINGS) ×1 IMPLANT
TAPE PAPER 3X10 WHT MICROPORE (GAUZE/BANDAGES/DRESSINGS) ×2 IMPLANT
TOWEL OR 17X24 6PK STRL BLUE (TOWEL DISPOSABLE) ×2 IMPLANT
TOWEL OR 17X26 10 PK STRL BLUE (TOWEL DISPOSABLE) ×2 IMPLANT
WAND SUCTION MAX 4MM 90S (SURGICAL WAND) ×2 IMPLANT
WATER STERILE IRR 1000ML POUR (IV SOLUTION) ×2 IMPLANT

## 2013-09-10 NOTE — Discharge Instructions (Signed)
Metta Clines. Supple, M.D., F.A.A.O.S. Orthopaedic Surgery Specializing in Arthroscopic and Reconstructive Surgery of the Shoulder and Knee [phone] [address] [place], [place] [address] - Fax [phone]  POST-OP SHOULDER ARTHROSCOPIC ROTATOR CUFF AND/OR LABRAL REPAIR INSTRUCTIONS  1. Call the office at [phone] to schedule your first post-op appointment 7-10 days from the date of your surgery.  2. Leave the steri-strips in place over your incisions when performing dressing changes and showering. You may remove your dressings and begin showering 72 hours from surgery. You can expect drainage that is clear to bloody in nature that occasionally will soak through your dressings. If this occurs go ahead and perform a dressing change. The drainage should lessen daily and when there is no drainage from your incisions feel free to go without a dressing.  3. Wear your sling/immobilizer at all times except to perform the exercises below or to occasionally let your arm dangle by your side to stretch your elbow. You also need to sleep in your sling immobilizer until instructed otherwise.  4. Range of motion to your elbow, wrist, and hand are encouraged 3-5 times daily. Exercise to your hand and fingers helps to reduce swelling you may experience.  5. Utilize ice to the shoulder 3-4 times minimum a day and additionally if you are experiencing pain.  6. You may one-armed drive when safely off of narcotics and muscle relaxants. You may use your hand that is in the sling to support the steering wheel only. However, should it be your right arm that is in the sling it is not to be used for gear shifting in a manual transmission.  7. If you had a block pre-operatively to provide post-op pain relief you may want to go ahead and begin utilizing your pain meds as your arm begins to wake up. Blocks can sometimes last up to 16-18 hours. If you are still pain-free prior to going to bed you may  want to strongly consider taking a pain medication to avoid being awakened in the night with the onset of pain. A muscle relaxant is also provided for you should you experience muscle spasms. It is recommended that if you are experiencing pain that your pain medication alone is not controlling, add the muscle relaxant along with the pain medication which can give additional pain relief. The first one to two days is generally the most severe of your pain and then should gradually decrease. As your pain lessens it is recommended that you decrease your use of the pain medications to an "as needed basis" only and to always comply with the recommended dosages of the pain medications.  8. Pain medications can produce constipation along with their use. If you experience this, the use of an over the counter stool softener or laxative daily is recommended.   9. For additional questions or concerns, please do not hesitate to call the office. If after hours there is an answering service to forward your concerns to the physician on call.  POST-OP EXERCISES  Pendulum Exercises  Perform pendulum exercises while standing and bending at the waist. Support your uninvolved arm on a table or chair and allow your operated arm to hang freely. Make sure to do these exercises passively - not using you shoulder muscle.  Repeat 20 times. Do 3 sessions per day.    What to eat:  For your first meals, you should eat lightly; only small meals initially.  If you do not have nausea, you may  eat larger meals.  Avoid spicy, greasy and heavy food.

## 2013-09-10 NOTE — Op Note (Signed)
09/10/2013  2:06 PM  PATIENT:   Kathleen Luna  48 y.o. female  PRE-OPERATIVE DIAGNOSIS:  right shoulder chronic impingement,symptomatic AC joint OA  POST-OPERATIVE DIAGNOSIS:  Same with rotator cuff tear, type 1 SLAP  PROCEDURE:  RSA, labral debridement, SAD, DCR, RCR  SURGEON:  Kalyssa Anker, Metta Clines M.D.  ASSISTANTS: Shuford pac   ANESTHESIA:   GET + ISB  EBL: min  SPECIMEN:  none  Drains: none   PATIENT DISPOSITION:  PACU - hemodynamically stable.    PLAN OF CARE: Discharge to home after PACU  Dictation# [id number]

## 2013-09-10 NOTE — Anesthesia Postprocedure Evaluation (Signed)
  Anesthesia Post-op Note  Patient: Kathleen Luna  Procedure(s) Performed: Procedure(s): RIGHT SHOULDER ARTHROSCOPY WITH SUBACROMIAL DECOMPRESSION AND DISTAL CLAVICLE RESECTION (Right)  Patient Location: PACU  Anesthesia Type:GA combined with regional for post-op pain  Level of Consciousness: awake, oriented, sedated and patient cooperative  Airway and Oxygen Therapy: Patient Spontanous Breathing  Post-op Pain: mild  Post-op Assessment: Post-op Vital signs reviewed, Patient's Cardiovascular Status Stable, Respiratory Function Stable, Patent Airway, No signs of Nausea or vomiting and Pain level controlled  Post-op Vital Signs: stable  Complications: No apparent anesthesia complications

## 2013-09-10 NOTE — Anesthesia Preprocedure Evaluation (Signed)
Anesthesia Evaluation  Patient identified by MRN, date of birth, ID band Patient awake    Reviewed: Allergy & Precautions, H&P , NPO status , Patient's Chart, lab work & pertinent test results  Airway       Dental   Pulmonary former smoker,          Cardiovascular     Neuro/Psych  Headaches,    GI/Hepatic   Endo/Other  Hypothyroidism   Renal/GU      Musculoskeletal   Abdominal   Peds  Hematology   Anesthesia Other Findings   Reproductive/Obstetrics                           Anesthesia Physical Anesthesia Plan  ASA: I  Anesthesia Plan: General   Post-op Pain Management:    Induction: Intravenous  Airway Management Planned: Oral ETT  Additional Equipment:   Intra-op Plan:   Post-operative Plan: Extubation in OR  Informed Consent: I have reviewed the patients History and Physical, chart, labs and discussed the procedure including the risks, benefits and alternatives for the proposed anesthesia with the patient or authorized representative who has indicated his/her understanding and acceptance.     Plan Discussed with:   Anesthesia Plan Comments:         Anesthesia Quick Evaluation

## 2013-09-10 NOTE — Preoperative (Signed)
Beta Blockers   Reason not to administer Beta Blockers:Not Applicable 

## 2013-09-10 NOTE — H&P (Signed)
Kathleen Luna    Chief Complaint: right shoulder chronic impingement HPI: The patient is a 48 y.o. female with chronic right shoulder impingement refractory to conservative management.  Past Medical History  Diagnosis Date  . SVD (spontaneous vaginal delivery)     x 1  . Hyperlipidemia   . Hypothyroidism   . Headache(784.0)     otc med prn    Past Surgical History  Procedure Laterality Date  . Breast surgery  09/2010    augmentation  . Wisdom tooth extraction    . Vulvar lesion removal  01/17/2012    Procedure: VULVAR LESION;  Surgeon: Allena Katz, MD;  Location: [facility];  Service: Gynecology;  Laterality: N/A;  incision and drainage  of perineal abscess  . Vulvar lesion removal  03/21/2012    Procedure: VULVAR LESION;  Surgeon: Allena Katz, MD;  Location: [facility];  Service: Gynecology;  Laterality: N/A;  I&D of vulvar abscess,poss resection    Family History  Problem Relation Age of Onset  . Hyperlipidemia Mother   . Hypertension Mother   . Early death Father   . Cancer Brother     Social History:  reports that she quit smoking about 16 years ago. Her smoking use included Cigarettes. She has a 7.5 pack-year smoking history. She has never used smokeless tobacco. She reports that she drinks alcohol. She reports that she does not use illicit drugs.  Allergies:  Allergies  Allergen Reactions  . Hydrocodone Itching    Medications Prior to Admission  Medication Sig Dispense Refill  . aspirin 81 MG tablet Take 81 mg by mouth daily.      Marland Kitchen atorvastatin (LIPITOR) 40 MG tablet Take 40 mg by mouth every evening.       . escitalopram (LEXAPRO) 20 MG tablet Take 20 mg by mouth daily.      Marland Kitchen levothyroxine (SYNTHROID, LEVOTHROID) 50 MCG tablet Take 50 mcg by mouth daily before breakfast.      . Multiple Vitamin (MULITIVITAMIN WITH MINERALS) TABS Take 1 tablet by mouth daily. Uses [employer] brand      . naphazoline-pheniramine (NAPHCON-A) 0.025-0.3 % ophthalmic solution  Place 1 drop into both eyes daily as needed. For eye irritation/allergies. Uses [employer] Equate brand.      . Omega-3 Fatty Acids (FISH OIL) 1000 MG CAPS Take 1 capsule by mouth daily.      Marland Kitchen zolpidem (AMBIEN) 10 MG tablet Take 10 mg by mouth at bedtime as needed for sleep.         Physical Exam: right shoulder with painful and restricted motion as noted at recent office visits  Vitals  Temp:  [98.3 F (36.8 C)] 98.3 F (36.8 C) (01/15 1035) Pulse Rate:  [81-90] 89 (01/15 1200) Resp:  [18-27] 18 (01/15 1200) BP: (127-159)/(55-74) 129/71 mmHg (01/15 1200) SpO2:  [96 %-98 %] 96 % (01/15 1200)  Assessment/Plan  Impression: right shoulder chronic impingement  Plan of Action: Procedure(s): RIGHT SHOULDER ARTHROSCOPY WITH SUBACROMIAL DECOMPRESSION AND DISTAL CLAVICLE RESECTION  Merdis Snodgrass M 09/10/2013, 12:05 PM

## 2013-09-10 NOTE — Transfer of Care (Signed)
Immediate Anesthesia Transfer of Care Note  Patient: Kathleen Luna  Procedure(s) Performed: Procedure(s): RIGHT SHOULDER ARTHROSCOPY WITH SUBACROMIAL DECOMPRESSION AND DISTAL CLAVICLE RESECTION (Right)  Patient Location: PACU  Anesthesia Type:General  Level of Consciousness: awake, alert , oriented and patient cooperative  Airway & Oxygen Therapy: Patient Spontanous Breathing and Patient connected to nasal cannula oxygen  Post-op Assessment: Report given to PACU RN and Post -op Vital signs reviewed and stable  Post vital signs: Reviewed  Complications: No apparent anesthesia complications

## 2013-09-10 NOTE — Anesthesia Procedure Notes (Addendum)
Anesthesia Regional Block:  Interscalene brachial plexus block  Pre-Anesthetic Checklist: ,, timeout performed, Correct Patient, Correct Site, Correct Laterality, Correct Procedure, Correct Position, site marked, Risks and benefits discussed,  Surgical consent,  Pre-op evaluation,  At surgeon's request and post-op pain management  Laterality: Right  Prep: chloraprep and alcohol swabs       Needles:  Injection technique: Single-shot  Needle Type: Stimulator Needle - 40        Needle insertion depth: 2 cm   Additional Needles:  Procedures: nerve stimulator Interscalene brachial plexus block  Nerve Stimulator or Paresthesia:  Response: 0.5 mA, 0.1 ms, 2 cm  Additional Responses:   Narrative:  Start time: 09/10/2013 11:50 AM End time: 09/10/2013 11:56 AM Injection made incrementally with aspirations every 5 mL.  Performed by: Personally  Anesthesiologist: Sharolyn Douglas MD  Additional Notes: Pt accepts procedure w/ risks. 14cc 0.5% Marcaine w/ epi w/o difficulty or discomfort. GES   Procedure Name: Intubation Date/Time: 09/10/2013 12:45 PM Performed by: Jenne Campus Pre-anesthesia Checklist: Patient identified, Emergency Drugs available, Suction available, Patient being monitored and Timeout performed Patient Re-evaluated:Patient Re-evaluated prior to inductionOxygen Delivery Method: Circle system utilized Preoxygenation: Pre-oxygenation with 100% oxygen Intubation Type: IV induction Ventilation: Mask ventilation without difficulty Laryngoscope Size: Miller and 2 Grade View: Grade I Tube type: Oral Tube size: 7.0 mm Number of attempts: 1 Airway Equipment and Method: Stylet Placement Confirmation: ETT inserted through vocal cords under direct vision,  positive ETCO2,  CO2 detector and breath sounds checked- equal and bilateral Secured at: 21 cm Tube secured with: Tape Dental Injury: Teeth and Oropharynx as per pre-operative assessment

## 2013-09-11 NOTE — Op Note (Signed)
NAMETYKESHIA, TOURANGEAU NO.:  [id number]  MEDICAL RECORD NO.:  [id number]  LOCATION:  [facility]                         FACILITY:  [facility]  PHYSICIAN:  Metta Clines. Merced Brougham, M.D.  DATE OF BIRTH:  [birth year]  DATE OF PROCEDURE:  09/10/2013 DATE OF DISCHARGE:  09/10/2013                              OPERATIVE REPORT   PREOPERATIVE DIAGNOSIS: 1. Chronic right shoulder pain with impingement syndrome, and possible     rotator cuff tear. 2. Right shoulder acromioclavicular joint arthropathy.  POSTOPERATIVE DIAGNOSIS: 1. Chronic right shoulder impingement syndrome. 2. Right shoulder full-thickness rotator cuff tear. 3. Right shoulder acromioclavicular joint arthropathy. 4. Right shoulder type 1 superior labrum anterior and posterior     lesion.  PROCEDURES: 1. Right shoulder examination under anesthesia. 2. Right shoulder glenohumeral joint diagnostic arthroscopy. 3. Debridement of type 1 superior labrum anterior and posterior     lesion. 4. Arthroscopic subacromial decompression and bursectomy. 5. Arthroscopic distal clavicle resection. 6. Arthroscopic rotator cuff repair using a double-row suture bridge     repair construct.  SURGEON:  Metta Clines. Fatiha Guzy, M.D.  Terrence DupontOlivia Mackie A. Shuford, PA-C.  ANESTHESIA:  General endotracheal as well as an interscalene block.  ESTIMATED BLOOD LOSS:  Minimal.  DRAINS:  None.  HISTORY:  Cortana is a 48 year old female who has had chronic and progressively increasing right shoulder pain with impingement syndrome, and plain radiographs showed overall good bony alignment, but suggestion of some early [facility] joint arthropathy.  Her symptoms had been refractory to prolonged and multiple attempts at conservative management.  Due to her increased pain and functional limitations, she is brought to the operating room at this time for planned right shoulder arthroscopy as described below.  Preoperatively, I counseled [place] treatment options as  well as risks versus benefits thereof.  Possible surgical complications were reviewed including potential for bleeding, infection, neurovascular injury, persistent pain, loss of motion, anesthetic complication, recurrence of rotator cuff tear, and possible need for additional surgery.  She understands and accepts and agrees with our planned procedure.  PROCEDURE IN DETAIL:  After undergoing routine preoperative evaluation, the patient received prophylactic antibiotics and an interscalene block was established in the holding area by the Anesthesia Department. Placed supine on the operating table, underwent smooth induction of a general endotracheal anesthesia.  Turned to the left lateral decubitus position on a beanbag and appropriately padded and protected.  Right shoulder examination under anesthesia revealed full motion, although there were no instability patterns.  Right arm was then suspended at 70 degrees abduction with 10 pounds of traction.  The right shoulder girdle region was sterilely prepped and draped in standard fashion.  Time-out was called.  A posterior portal was established into the glenohumeral joint and anterior portal was established under direct visualization. The articular surfaces were all in pristine condition.  No instability patterns were noted.  The superior labrum showed some minimal degenerative tearing consistent with type 1 SLAP lesion.  This was debrided with shaver to a stable margin.  Superior labrum was stable, biceps anchor was stable.  Biceps had normal caliber.  There was significant at least articular-sided tearing of the rotator cuff, which we debrided with  a shaver, and did not see any obvious full-thickness defects looking from the articular side.  At this point, fluid and instruments were then removed.  The arm was dropped down to 30 degrees abduction with the arthroscope introduced into the subacromial space to the posterior portal and direct  lateral portal established in the subacromial space.  Abundant dense bursal tissue and multiple adhesions were encountered and these were all divided and excised with a combination of shaver and Stryker wand.  The wand was then used to remove the periosteum and undersurface of the anterior acromion.  There was a prominent anterior-inferior acromial "hook."  It was used to perform subacromial decompression creating a type 1 involved morphology. Portal was then established directly anterior to the distal clavicle and distal clavicle resection was performed with a bur.  Care was taken to confirm visualization of the entire circumference of the distal clavicle to ensure adequate removal of the bone.  We then completed the subacromial/subdeltoid bursectomy.  There was obviously a significant at least bursal-sided tear of the rotator cuff and the torn portions were debrided with a shaver and as it turns out just a very thin layer of residual intact rotator cuff, which I went ahead and completed the tear and ultimately had a full-thickness defect approximately 2.5 cm in width.  This was debrided back to a stable margin and healthy tissue with a shaver.  The greater tuberosity was prepared gently abrading the bone at the tuberosity on the footprint.  Accessory portal device was then established and through a stab wound in the lateral margin of the acromion placed an Arthrex PEEK corkscrew suture anchor.  The limbs of the suture anchor were then shuttled through the free margin of the rotator cuff in a horizontal mattress pattern and then tied with sliding locking knots followed by multiple overhand throws and alternating posts.  We then created a "suture bridge" and 2 swivel-lock suture anchors, which nicely compressed the free margin of the rotator cuff against bony bed of the tuberosity and an overall construct was much to our satisfaction.  Suture limbs were clipped.  The bursectomy  was completed.  Hemostasis was obtained.  Fluid and instruments were removed.  The portals were closed with Monocryl and Steri-Strips were applied.  Dry dressing taped at the right shoulder, right arm was placed in a sling.  The patient was awakened, extubated, and taken to the recovery room in a stable condition.  Jenetta Loges, PA-C was used as an Environmental consultant throughout the case, essential for help with positioning Ms. Sheral, management of the arthroscopic equipment, tissue manipulation, wound closure, and intraoperative decision making.     Metta Clines. Mitchelle Goerner, M.D.    KMS/MEDQ  D:  09/10/2013  T:  09/11/2013  Job:  [id number]

## 2013-09-22 ENCOUNTER — Other Ambulatory Visit (HOSPITAL_COMMUNITY): Payer: No Typology Code available for payment source

## 2013-11-17 ENCOUNTER — Other Ambulatory Visit: Payer: Self-pay | Admitting: Physician Assistant

## 2014-04-16 ENCOUNTER — Other Ambulatory Visit: Payer: Self-pay | Admitting: Gynecology

## 2014-04-16 DIAGNOSIS — R928 Other abnormal and inconclusive findings on diagnostic imaging of breast: Secondary | ICD-10-CM

## 2014-04-23 ENCOUNTER — Ambulatory Visit
Admission: RE | Admit: 2014-04-23 | Discharge: 2014-04-23 | Disposition: A | Discharge status: Home / Self Care | Payer: No Typology Code available for payment source | Source: Ambulatory Visit | Attending: Gynecology | Admitting: Gynecology

## 2014-04-23 ENCOUNTER — Encounter (INDEPENDENT_AMBULATORY_CARE_PROVIDER_SITE_OTHER): Payer: Self-pay

## 2014-04-23 DIAGNOSIS — R928 Other abnormal and inconclusive findings on diagnostic imaging of breast: Secondary | ICD-10-CM

## 2014-05-11 ENCOUNTER — Encounter: Payer: Self-pay | Admitting: Cardiology

## 2014-05-11 ENCOUNTER — Other Ambulatory Visit: Payer: Self-pay | Admitting: *Deleted

## 2014-05-11 ENCOUNTER — Ambulatory Visit (INDEPENDENT_AMBULATORY_CARE_PROVIDER_SITE_OTHER): Payer: No Typology Code available for payment source | Admitting: Cardiology

## 2014-05-11 VITALS — BP 134/86 | HR 69 | Ht 64.0 in | Wt 138.7 lb

## 2014-05-11 DIAGNOSIS — I10 Essential (primary) hypertension: Secondary | ICD-10-CM

## 2014-05-11 DIAGNOSIS — IMO0001 Reserved for inherently not codable concepts without codable children: Secondary | ICD-10-CM

## 2014-05-11 DIAGNOSIS — R9431 Abnormal electrocardiogram [ECG] [EKG]: Secondary | ICD-10-CM | POA: Insufficient documentation

## 2014-05-11 DIAGNOSIS — F17200 Nicotine dependence, unspecified, uncomplicated: Secondary | ICD-10-CM | POA: Insufficient documentation

## 2014-05-11 DIAGNOSIS — Z8249 Family history of ischemic heart disease and other diseases of the circulatory system: Secondary | ICD-10-CM | POA: Insufficient documentation

## 2014-05-11 DIAGNOSIS — F439 Reaction to severe stress, unspecified: Secondary | ICD-10-CM | POA: Insufficient documentation

## 2014-05-11 DIAGNOSIS — E785 Hyperlipidemia, unspecified: Secondary | ICD-10-CM | POA: Insufficient documentation

## 2014-05-11 DIAGNOSIS — Z733 Stress, not elsewhere classified: Secondary | ICD-10-CM

## 2014-05-11 HISTORY — DX: Nicotine dependence, unspecified, uncomplicated: F17.200

## 2014-05-11 HISTORY — DX: Reserved for inherently not codable concepts without codable children: IMO0001

## 2014-05-11 HISTORY — DX: Essential (primary) hypertension: I10

## 2014-05-11 NOTE — Assessment & Plan Note (Signed)
B/P recently noted to borderline high

## 2014-05-11 NOTE — Progress Notes (Signed)
05/11/2014 Kathleen Luna   09/25/1965  [id number]  Primary Physicia Donnajean Lopes, MD Primary Cardiologist: Dr Martinique  HPI:  [name], works as a Orthoptist at [employer], referred by Dr Sharlett Iles for an abnormal EKG. The pt was previously seen by Dr Martinique-? 10 yrs ago- she thinks for an abnormal EKG. She had a stress test then that was normal. 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 She and her husband separated and she stopped running and resumed smoking. At Dr Buel Ream office her EKG was noted to be abnormal. There are no old EKGs available for comparison. The pt denies chest pain or unusual dyspnea. but did have some Lt arm pain for a few days but this did not sound like angina. It was not exertional and it was constant. Her B/P has also been noted to be elevated- 138/90 at her PCPs office.    Current Outpatient Prescriptions  Medication Sig Dispense Refill  . ALPRAZolam (XANAX) 0.25 MG tablet Take 0.25 mg by mouth 3 (three) times daily as needed.       . ARIPiprazole (ABILIFY) 5 MG tablet Take 5 mg by mouth daily.       Marland Kitchen aspirin 81 MG tablet Take 81 mg by mouth daily.      Marland Kitchen atorvastatin (LIPITOR) 40 MG tablet Take 40 mg by mouth every evening.       . escitalopram (LEXAPRO) 20 MG tablet Take 20 mg by mouth daily.      Marland Kitchen levothyroxine (SYNTHROID, LEVOTHROID) 50 MCG tablet Take 50 mcg by mouth daily before breakfast.      . Multiple Vitamin (MULITIVITAMIN WITH MINERALS) TABS Take 1 tablet by mouth daily. Uses Kirkland brand      . valACYclovir (VALTREX) 1000 MG tablet        No current facility-administered medications for this visit.    Allergies  Allergen Reactions  . Hydrocodone Itching    History   Social History  . Marital Status: Married    Spouse Name: N/A    Number of Children: N/A  . Years of Education: N/A   Occupational History  . Not on file.   Social History Main Topics  . Smoking status: Current Every Day Smoker -- 1.00  packs/day for 15 years    Types: Cigarettes    Last Attempt to Quit: 07/07/1997  . Smokeless tobacco: Never Used     Comment: RESTART SMOKING  . Alcohol Use: Yes     Comment: weekends  . Drug Use: No  . Sexual Activity: Yes    Birth Control/ Protection: IUD   Other Topics Concern  . Not on file   Social History Narrative  . No narrative on file     Review of Systems: General: negative for chills, fever, night sweats or weight changes.  Cardiovascular: negative for chest pain, dyspnea on exertion, edema, orthopnea, palpitations, paroxysmal nocturnal dyspnea or shortness of breath Dermatological: negative for rash Respiratory: negative for cough or wheezing Urologic: negative for hematuria Abdominal: negative for nausea, vomiting, diarrhea, bright red blood per rectum, melena, or hematemesis Neurologic: negative for visual changes, syncope, or dizziness All other systems reviewed and are otherwise negative except as noted above.    Pulse 69, height 5\' 4"  (1.626 m), weight 138 lb 11.2 oz (62.914 kg).  General appearance: alert, cooperative and no distress Neck: no carotid bruit and no JVD Lungs: clear to auscultation bilaterally Heart: regular rate and rhythm Abdomen: soft,  non-tender; bowel sounds normal; no masses,  no organomegaly Extremities: extremities normal, atraumatic, no cyanosis or edema Pulses: 2+ and symmetric Skin: Skin color, texture, turgor normal. No rashes or lesions Neurologic: Grossly normal  EKG NSR 68 Q wave V2  ASSESSMENT AND PLAN:   Abnormal EKG- septal Q waves Pt referred for abnormal findings on her EKG  HTN (hypertension) B/P recently noted to borderline high  Dyslipidemia Followed by PCP, she is on statin  Family history of coronary artery disease in father F-died MI at 68 y/o  Smoking Quit 20 yrs ago and recently resumed 1-1.5 pks/day  Situational stress .    PLAN  Ms Kathleen Luna has several cardiac risk factors and recently has  been under a great deal of stress. She has an abnormal EKG. I have ordered an exercise Myoview. She takes an ASA daily. She'll follow up with Dr Martinique after her GXT. She admits her diet has been poor recently. She will cut down on her smoking and resume a healthy diet and record her B/P at home.   Yalissa Fink KPA-C 05/11/2014 8:40 AM

## 2014-05-11 NOTE — Patient Instructions (Signed)
Your physician has requested that you have en exercise stress myoview. For further information please visit [email or web address]. Please follow instruction sheet, as given.  Your physician recommends that you schedule a follow-up appointment in AFTER EXERCISE MYOVIEW WITH DR Martinique.

## 2014-05-11 NOTE — Assessment & Plan Note (Signed)
Pt referred for abnormal findings on her EKG

## 2014-05-11 NOTE — Assessment & Plan Note (Signed)
Followed by PCP, she is on statin

## 2014-05-11 NOTE — Assessment & Plan Note (Signed)
F-died MI at 48 y/o

## 2014-05-11 NOTE — Assessment & Plan Note (Signed)
Quit 20 yrs ago and recently resumed 1-1.5 pks/day

## 2014-05-14 ENCOUNTER — Telehealth (HOSPITAL_COMMUNITY): Payer: Self-pay

## 2014-05-14 NOTE — Telephone Encounter (Signed)
Encounter complete. 

## 2014-05-19 ENCOUNTER — Ambulatory Visit (HOSPITAL_COMMUNITY)
Admission: RE | Admit: 2014-05-19 | Discharge: 2014-05-19 | Disposition: A | Discharge status: Home / Self Care | Payer: No Typology Code available for payment source | Source: Ambulatory Visit | Attending: Cardiology | Admitting: Cardiology

## 2014-05-19 DIAGNOSIS — I10 Essential (primary) hypertension: Secondary | ICD-10-CM | POA: Insufficient documentation

## 2014-05-19 DIAGNOSIS — E785 Hyperlipidemia, unspecified: Secondary | ICD-10-CM | POA: Diagnosis not present

## 2014-05-19 DIAGNOSIS — F17200 Nicotine dependence, unspecified, uncomplicated: Secondary | ICD-10-CM | POA: Diagnosis not present

## 2014-05-19 DIAGNOSIS — Z8249 Family history of ischemic heart disease and other diseases of the circulatory system: Secondary | ICD-10-CM | POA: Insufficient documentation

## 2014-05-19 DIAGNOSIS — R9431 Abnormal electrocardiogram [ECG] [EKG]: Secondary | ICD-10-CM | POA: Diagnosis not present

## 2014-05-19 MED ORDER — TECHNETIUM TC 99M SESTAMIBI GENERIC - CARDIOLITE
10.3000 | Freq: Once | INTRAVENOUS | Status: AC | PRN
  Administered 2014-05-19: 10 via INTRAVENOUS

## 2014-05-19 MED ORDER — TECHNETIUM TC 99M SESTAMIBI GENERIC - CARDIOLITE
29.8000 | Freq: Once | INTRAVENOUS | Status: AC | PRN
  Administered 2014-05-19: 29.8 via INTRAVENOUS

## 2014-05-19 NOTE — Procedures (Addendum)
[facility] [address] [place] [address] [phone]  Cardiology Nuclear Med Study  Kathleen Luna is a 48 y.o. female     MRN : [id number]     DOB: [birth year]  Procedure Date: 05/19/2014  Nuclear Med Background Indication for Stress Test:  Evaluation for Ischemia and Abnormal EKG History:  no prior nuclear MPI  Cardiac Risk Factors: Family History - CAD, Hypertension, Lipids and Smoker  Symptoms:  SOB and Left arm pain   Nuclear Pre-Procedure Caffeine/Decaff Intake:  7:00pm NPO After: 5:00am   IV Site: R Antecubital  IV 0.9% NS with Angio Cath:  22g  Chest Size (in):  n/a IV Started by: Otho Perl, CNMT  Height: 5\' 4"  (1.626 m)  Cup Size: 36D Bil Implants  BMI:  Body mass index is 23.68 kg/(m^2). Weight:  138 lb (62.596 kg)   Tech Comments:  Bilateral breast implants    Nuclear Med Study 1 or 2 day study: 1 day  Stress Test Type:  Stress  Order Authorizing Provider:  Peter Martinique, MD   Resting Radionuclide: Technetium 75m Sestamibi  Resting Radionuclide Dose: 10.3 mCi   Stress Radionuclide:  Technetium 73m Sestamibi  Stress Radionuclide Dose: 29.8 mCi           Stress Protocol Rest HR: 74 Stress HR:148  Rest BP: 150/99 Stress BP: 177/98  Exercise Time (min): 10:02 METS: 10.80   Predicted Max HR: 170 bpm % Max HR: 87.06 bpm Rate Pressure Product: 26196  Dose of Adenosine (mg):  n/a Dose of Lexiscan: n/a mg  Dose of Atropine (mg): n/a Dose of Dobutamine: n/a mcg/kg/min (at max HR)  Stress Test Technologist: Mellody Memos, CCT Nuclear Technologist: Imagene Riches, CNMT   Rest Procedure:  Myocardial perfusion imaging was performed at rest 45 minutes following the intravenous administration of Technetium 68m Sestamibi. Stress Procedure:  The patient performed treadmill exercise using a Bruce  Protocol for 10 minutes 2 seconds. The patient stopped due to extreme head pain and denied any chest pain.  There  were no significant ST-T wave changes.  Technetium 75m Sestamibi was injected IV at peak exercise and myocardial perfusion imaging was performed after a brief delay.  Transient Ischemic Dilatation (Normal <1.22):  1.01  QGS EDV:  85 ml QGS ESV:  30 ml LV Ejection Fraction: 64%      Rest ECG: NSR - Normal EKG  Stress ECG: No significant change from baseline ECG  QPS Raw Data Images:  There is a breast shadow that accounts for the anterior attenuation. Stress Images:  Normal homogeneous uptake in all areas of the myocardium. Rest Images:  Comparison with the stress images reveals no significant change. Subtraction (SDS):  No evidence of ischemia. LV Wall Motion:  NL LV Function; NL Wall Motion  Impression Exercise Capacity:  Good exercise capacity. BP Response:  Hypertensive blood pressure response. Clinical Symptoms:  No significant symptoms noted. ECG Impression:  No significant ST segment change suggestive of ischemia. Comparison with Prior Nuclear Study: No previous nuclear study performed   Overall Impression:  Low risk stress nuclear study with mild breast attenuation artifact.[name], MD  05/19/2014 10:45 AM

## 2014-06-09 ENCOUNTER — Encounter: Payer: Self-pay | Admitting: Cardiology

## 2014-06-09 ENCOUNTER — Ambulatory Visit (INDEPENDENT_AMBULATORY_CARE_PROVIDER_SITE_OTHER): Payer: No Typology Code available for payment source | Admitting: Cardiology

## 2014-06-09 VITALS — BP 128/72 | HR 80 | Ht 64.75 in | Wt 141.0 lb

## 2014-06-09 DIAGNOSIS — I10 Essential (primary) hypertension: Secondary | ICD-10-CM

## 2014-06-09 DIAGNOSIS — E785 Hyperlipidemia, unspecified: Secondary | ICD-10-CM

## 2014-06-09 DIAGNOSIS — F17200 Nicotine dependence, unspecified, uncomplicated: Secondary | ICD-10-CM

## 2014-06-09 DIAGNOSIS — Z720 Tobacco use: Secondary | ICD-10-CM

## 2014-06-09 DIAGNOSIS — R9431 Abnormal electrocardiogram [ECG] [EKG]: Secondary | ICD-10-CM

## 2014-06-09 NOTE — Progress Notes (Signed)
Kathleen Luna Date of Birth: 17-Sep-1965 Medical Record #[id number]  History of Present Illness: Kathleen Luna is seen for follow up after recent evaluation for left arm pain and abnormal Ecg. Ecg suggested an old septal infarct. She has risk factors of HTN and tobacco abuse as well as family history of CAD. Her left arm pain has resolved.     Medication List       This list is accurate as of: 06/09/14  5:12 PM.  Always use your most recent med list.               ALPRAZolam 0.25 MG tablet  Commonly known as:  XANAX  Take 0.25 mg by mouth 3 (three) times daily as needed.     ARIPiprazole 5 MG tablet  Commonly known as:  ABILIFY  Take 5 mg by mouth as needed.     aspirin 81 MG tablet  Take 81 mg by mouth daily.     atorvastatin 40 MG tablet  Commonly known as:  LIPITOR  Take 40 mg by mouth every evening.     escitalopram 20 MG tablet  Commonly known as:  LEXAPRO  Take 20 mg by mouth as needed.     levothyroxine 50 MCG tablet  Commonly known as:  SYNTHROID, LEVOTHROID  Take 50 mcg by mouth daily before breakfast.     multivitamin with minerals Tabs tablet  Take 1 tablet by mouth daily. Uses Kirkland brand     valACYclovir 1000 MG tablet  Commonly known as:  VALTREX         Allergies  Allergen Reactions  . Hydrocodone Itching    Past Medical History  Diagnosis Date  . SVD (spontaneous vaginal delivery)     x 1  . Hyperlipidemia   . Hypothyroidism   . Headache(784.0)     otc med prn    Past Surgical History  Procedure Laterality Date  . Breast surgery  09/2010    augmentation  . Wisdom tooth extraction    . Vulvar lesion removal  01/17/2012    Procedure: VULVAR LESION;  Surgeon: Allena Katz, MD;  Location: [facility];  Service: Gynecology;  Laterality: N/A;  incision and drainage  of perineal abscess  . Vulvar lesion removal  03/21/2012    Procedure: VULVAR LESION;  Surgeon: Allena Katz, MD;  Location: [facility];  Service: Gynecology;  Laterality:  N/A;  I&D of vulvar abscess,poss resection    History   Social History  . Marital Status: Married    Spouse Name: N/A    Number of Children: N/A  . Years of Education: N/A   Social History Main Topics  . Smoking status: Current Every Day Smoker -- 1.00 packs/day for 15 years    Types: Cigarettes    Last Attempt to Quit: 07/07/1997  . Smokeless tobacco: Never Used     Comment: RESTART SMOKING  . Alcohol Use: Yes     Comment: weekends  . Drug Use: No  . Sexual Activity: Yes    Birth Control/ Protection: IUD   Other Topics Concern  . None   Social History Narrative  . None    Family History  Problem Relation Age of Onset  . Hyperlipidemia Mother   . Hypertension Mother   . Early death Father   . Cancer Brother   . Dementia Mother   . Heart attack Father   . Heart disease Sister     Review of Systems: As noted  in HPI.  All other systems were reviewed and are negative.  Physical Exam: BP 128/72  Pulse 80  Ht 5' 4.75" (1.645 m)  Wt 141 lb (63.957 kg)  BMI 23.64 kg/m2 Filed Weights   06/09/14 1623  Weight: 141 lb (63.957 kg)  GENERAL:  Well appearing WF in NAD HEENT:  PERRL, EOMI, sclera are clear. Oropharynx is clear. Ecchymosis under right eye. NECK:  No jugular venous distention, carotid upstroke brisk and symmetric, no bruits, no thyromegaly or adenopathy LUNGS:  Clear to auscultation bilaterally CHEST:  Unremarkable HEART:  RRR,  PMI not displaced or sustained,S1 and S2 within normal limits, no S3, no S4: no clicks, no rubs, no murmurs ABD:  Soft, nontender. BS +, no masses or bruits. No hepatomegaly, no splenomegaly EXT:  2 + pulses throughout, no edema, no cyanosis no clubbing SKIN:  Warm and dry.  No rashes NEURO:  Alert and oriented x 3. Cranial nerves II through XII intact. PSYCH:  Cognitively intact    LABORATORY DATA: Cardiology Nuclear Med Study  Kathleen Luna is a 48 y.o. female MRN : [id number] DOB: 10/11/1965  Procedure Date: 05/19/2014    Nuclear Med Background  Indication for Stress Test: Evaluation for Ischemia and Abnormal EKG  History: no prior nuclear MPI  Cardiac Risk Factors: Family History - CAD, Hypertension, Lipids and Smoker  Symptoms: SOB and Left arm pain  Nuclear Pre-Procedure  Caffeine/Decaff Intake: 7:00pm  NPO After: 5:00am   IV Site: R Antecubital  IV 0.9% NS with Angio Cath: 22g   Chest Size (in): n/a  IV Started by: Otho Perl, CNMT   Height: 5\' 4"  (1.626 m)  Cup Size: 36D Bil Implants   BMI: Body mass index is 23.68 kg/(m^2).  Weight: 138 lb (62.596 kg)    Tech Comments: Bilateral breast implants   Nuclear Med Study  1 or 2 day study: 1 day  Stress Test Type: Stress   Order Authorizing Provider: Dequita Schleicher Martinique, MD    Resting Radionuclide: Technetium 34m Sestamibi  Resting Radionuclide Dose: 10.3 mCi   Stress Radionuclide: Technetium 27m Sestamibi  Stress Radionuclide Dose: 29.8 mCi   Stress Protocol  Rest HR: 74  Stress HR:148   Rest BP: 150/99  Stress BP: 177/98   Exercise Time (min): 10:02  METS: 10.80   Predicted Max HR: 170 bpm  % Max HR: 87.06 bpm  Rate Pressure Product: 26196  Dose of Adenosine (mg): n/a  Dose of Lexiscan: n/a mg   Dose of Atropine (mg): n/a  Dose of Dobutamine: n/a mcg/kg/min (at max HR)   Stress Test Technologist: Mellody Memos, CCT  Nuclear Technologist: Imagene Riches, CNMT   Rest Procedure: Myocardial perfusion imaging was performed at rest 45 minutes following the intravenous administration of Technetium 50m Sestamibi.  Stress Procedure: The patient performed treadmill exercise using a Bruce Protocol for 10 minutes 2 seconds. The patient stopped due to extreme head pain and denied any chest pain. There were no significant ST-T wave changes. Technetium 72m Sestamibi was injected IV at peak exercise and myocardial perfusion imaging was performed after a brief delay.  Transient Ischemic Dilatation (Normal <1.22): 1.01  QGS EDV: 85 ml  QGS ESV: 30 ml  LV Ejection  Fraction: 64%  Rest ECG: NSR - Normal EKG  Stress ECG: No significant change from baseline ECG  QPS  Raw Data Images: There is a breast shadow that accounts for the anterior attenuation.  Stress Images: Normal homogeneous uptake in all areas of the myocardium.  Rest Images: Comparison with the stress images reveals no significant change.  Subtraction (SDS): No evidence of ischemia.  LV Wall Motion: NL LV Function; NL Wall Motion  Impression  Exercise Capacity: Good exercise capacity.  BP Response: Hypertensive blood pressure response.  Clinical Symptoms: No significant symptoms noted.  ECG Impression: No significant ST segment change suggestive of ischemia.  Comparison with Prior Nuclear Study: No previous nuclear study performed  Overall Impression: Low risk stress nuclear study with mild breast attenuation artifact.[name], MD  05/19/2014 10:45 AM      Assessment / Plan: 1. Abnormal Ecg. Normal stress myoview. I think her Ecg changes are probably due to prior breast augmentation surgery. No further work up needed.  2. HTN controlled. 3. Tobacco abuse- patient motivated to quit cold [place] this month.

## 2014-06-11 ENCOUNTER — Other Ambulatory Visit: Payer: No Typology Code available for payment source

## 2014-06-11 ENCOUNTER — Other Ambulatory Visit: Payer: Self-pay | Admitting: Orthopedic Surgery

## 2014-06-11 DIAGNOSIS — R51 Headache: Secondary | ICD-10-CM

## 2014-06-11 DIAGNOSIS — R519 Headache, unspecified: Secondary | ICD-10-CM

## 2014-06-11 DIAGNOSIS — H5711 Ocular pain, right eye: Secondary | ICD-10-CM

## 2014-06-15 ENCOUNTER — Other Ambulatory Visit: Payer: No Typology Code available for payment source

## 2014-06-17 ENCOUNTER — Ambulatory Visit
Admission: RE | Admit: 2014-06-17 | Discharge: 2014-06-17 | Disposition: A | Discharge status: Home / Self Care | Payer: No Typology Code available for payment source | Source: Ambulatory Visit | Attending: Orthopedic Surgery | Admitting: Orthopedic Surgery

## 2014-06-17 DIAGNOSIS — R519 Headache, unspecified: Secondary | ICD-10-CM

## 2014-06-17 DIAGNOSIS — R51 Headache: Secondary | ICD-10-CM

## 2014-06-17 DIAGNOSIS — H5711 Ocular pain, right eye: Secondary | ICD-10-CM

## 2014-06-28 ENCOUNTER — Encounter: Payer: Self-pay | Admitting: Cardiology

## 2014-09-09 ENCOUNTER — Encounter (HOSPITAL_COMMUNITY): Payer: Self-pay | Admitting: Obstetrics and Gynecology

## 2015-01-11 ENCOUNTER — Other Ambulatory Visit: Payer: Self-pay | Admitting: Physician Assistant

## 2015-01-19 ENCOUNTER — Other Ambulatory Visit: Payer: Self-pay | Admitting: Gynecology

## 2015-01-20 LAB — CYTOLOGY - PAP

## 2015-02-03 ENCOUNTER — Encounter (HOSPITAL_COMMUNITY): Payer: Self-pay | Admitting: Obstetrics and Gynecology

## 2015-12-29 ENCOUNTER — Encounter (HOSPITAL_COMMUNITY): Payer: Self-pay | Admitting: Nurse Practitioner

## 2015-12-29 ENCOUNTER — Emergency Department (HOSPITAL_COMMUNITY)
Admission: EM | Admit: 2015-12-29 | Discharge: 2015-12-30 | Disposition: A | Discharge status: Home / Self Care | Payer: Managed Care, Other (non HMO) | Attending: Emergency Medicine | Admitting: Emergency Medicine

## 2015-12-29 ENCOUNTER — Emergency Department (HOSPITAL_COMMUNITY): Payer: Managed Care, Other (non HMO)

## 2015-12-29 DIAGNOSIS — E039 Hypothyroidism, unspecified: Secondary | ICD-10-CM | POA: Insufficient documentation

## 2015-12-29 DIAGNOSIS — E785 Hyperlipidemia, unspecified: Secondary | ICD-10-CM | POA: Diagnosis not present

## 2015-12-29 DIAGNOSIS — K279 Peptic ulcer, site unspecified, unspecified as acute or chronic, without hemorrhage or perforation: Secondary | ICD-10-CM | POA: Diagnosis not present

## 2015-12-29 DIAGNOSIS — Z7982 Long term (current) use of aspirin: Secondary | ICD-10-CM | POA: Insufficient documentation

## 2015-12-29 DIAGNOSIS — R1013 Epigastric pain: Secondary | ICD-10-CM

## 2015-12-29 DIAGNOSIS — Z79899 Other long term (current) drug therapy: Secondary | ICD-10-CM | POA: Insufficient documentation

## 2015-12-29 DIAGNOSIS — F17210 Nicotine dependence, cigarettes, uncomplicated: Secondary | ICD-10-CM | POA: Diagnosis not present

## 2015-12-29 LAB — COMPREHENSIVE METABOLIC PANEL
ALT: 13 U/L — AB (ref 14–54)
ANION GAP: 7 (ref 5–15)
AST: 20 U/L (ref 15–41)
Albumin: 4.3 g/dL (ref 3.5–5.0)
Alkaline Phosphatase: 83 U/L (ref 38–126)
BUN: 19 mg/dL (ref 6–20)
CHLORIDE: 105 mmol/L (ref 101–111)
CO2: 28 mmol/L (ref 22–32)
CREATININE: 0.86 mg/dL (ref 0.44–1.00)
Calcium: 9.6 mg/dL (ref 8.9–10.3)
GFR calc non Af Amer: >60 mL/min (ref >60)
Glucose, Bld: 99 mg/dL (ref 65–99)
POTASSIUM: 4.1 mmol/L (ref 3.5–5.1)
Sodium: 140 mmol/L (ref 135–145)
Total Bilirubin: 0.8 mg/dL (ref 0.3–1.2)
Total Protein: 7.5 g/dL (ref 6.5–8.1)

## 2015-12-29 LAB — CBC WITH DIFFERENTIAL/PLATELET
Basophils Absolute: 0 10*3/uL (ref 0.0–0.1)
Basophils Relative: 0 %
EOS ABS: 0.3 10*3/uL (ref 0.0–0.7)
Eosinophils Relative: 2 %
HCT: 39.9 % (ref 36.0–46.0)
Hemoglobin: 13.5 g/dL (ref 12.0–15.0)
Lymphocytes Relative: 34 %
Lymphs Abs: 3.8 10*3/uL (ref 0.7–4.0)
MCH: 30.7 pg (ref 26.0–34.0)
MCHC: 33.8 g/dL (ref 30.0–36.0)
MCV: 90.7 fL (ref 78.0–100.0)
Monocytes Absolute: 0.7 10*3/uL (ref 0.1–1.0)
Monocytes Relative: 6 %
NEUTROS PCT: 58 %
Neutro Abs: 6.6 10*3/uL (ref 1.7–7.7)
Platelets: 265 10*3/uL (ref 150–400)
RBC: 4.4 MIL/uL (ref 3.87–5.11)
RDW: 13.9 % (ref 11.5–15.5)
WBC: 11.4 10*3/uL — AB (ref 4.0–10.5)

## 2015-12-29 LAB — URINALYSIS, ROUTINE W REFLEX MICROSCOPIC
BILIRUBIN URINE: NEGATIVE
Glucose, UA: NEGATIVE mg/dL
Ketones, ur: NEGATIVE mg/dL
LEUKOCYTES UA: NEGATIVE
NITRITE: NEGATIVE
Protein, ur: NEGATIVE mg/dL
SPECIFIC GRAVITY, URINE: 1.006 (ref 1.005–1.030)
pH: 7 (ref 5.0–8.0)

## 2015-12-29 LAB — URINE MICROSCOPIC-ADD ON
BACTERIA UA: NONE SEEN
WBC, UA: NONE SEEN WBC/hpf (ref 0–5)

## 2015-12-29 LAB — LIPASE, BLOOD: LIPASE: 24 U/L (ref 11–51)

## 2015-12-29 MED ORDER — GI COCKTAIL ~~LOC~~
30.0000 mL | Freq: Once | ORAL | Status: AC
  Administered 2015-12-29: 30 mL via ORAL
  Filled 2015-12-29: qty 30

## 2015-12-29 NOTE — ED Notes (Signed)
Pt reports severe progressive abdominal pain, localizing on the epigastric region, also short of breath, onset yesterday a.m worsened today, denies GI hx, concerned if the symptoms are cardiac related.

## 2015-12-29 NOTE — ED Provider Notes (Signed)
CSN: [id number]     Arrival date & time 12/29/15  2158 History  By signing my name below, I, [name], attest that this documentation has been prepared under the direction and in the presence of non-physician practitioner, Antonietta Breach, PA-C. Electronically Signed: [name], Scribe. 12/29/2015. 11:51 PM.   Chief Complaint  Patient presents with  . Shortness of Breath  . Abdominal Pain   The history is provided by the patient. No language interpreter was used.   HPI Comments:  Kathleen Luna is a 50 y.o. female with PMHx of HLD who presents to the Emergency Department complaining of worsening epigastric abdominal pain onset yesterday morning, intermittent yesterday and constant today. Pain is worse after eating, normally feels dull then like pressure when it worsens, and is occasionally sharp. Pt reports associated frequent bowel movements and shortness of breath secondary to pain. She took 2 laxatives, magnesium citrate and OTC liquid antacid yesterday without pain relief. Pt notes seeing a gastroenterologist Dr. Watt Climes ~20 years ago for bowel issues caused by hypothyroidism. Her last colonoscopy was 1 year ago. Denies h/o reflux, nausea, vomiting, bloody stools, past abdominal surgeries, fever, or urinary symptoms   Past Medical History  Diagnosis Date  . SVD (spontaneous vaginal delivery)     x 1  . Hyperlipidemia   . Hypothyroidism   . Headache(784.0)     otc med prn   Past Surgical History  Procedure Laterality Date  . Breast surgery  09/2010    augmentation  . Wisdom tooth extraction    . Vulvar lesion removal  01/17/2012    Procedure: VULVAR LESION;  Surgeon: Allena Katz, MD;  Location: [facility];  Service: Gynecology;  Laterality: N/A;  incision and drainage  of perineal abscess   Family History  Problem Relation Age of Onset  . Hyperlipidemia Mother   . Hypertension Mother   . Early death Father   . Cancer Brother   . Dementia Mother   . Heart attack  Father   . Heart disease Sister    Social History  Substance Use Topics  . Smoking status: Current Every Day Smoker -- 1.00 packs/day for 15 years    Types: Cigarettes    Last Attempt to Quit: 07/07/1997  . Smokeless tobacco: Never Used     Comment: RESTART SMOKING  . Alcohol Use: Yes     Comment: weekends   OB History    Gravida Para Term Preterm AB TAB SAB Ectopic Multiple Living   1 1 1       1      Review of Systems  Constitutional: Negative for fever.  Respiratory: Positive for shortness of breath.   Gastrointestinal: Positive for abdominal pain. Negative for nausea, vomiting, constipation and blood in stool.  Genitourinary: Negative for dysuria and hematuria.  All other systems reviewed and are negative.  Allergies  Hydrocodone  Home Medications   Prior to Admission medications   Medication Sig Start Date End Date Taking? Authorizing Provider  aspirin 81 MG tablet Take 81 mg by mouth daily.   Yes Historical Provider, MD  atorvastatin (LIPITOR) 40 MG tablet Take 40 mg by mouth every evening.    Yes Historical Provider, MD  cetirizine (ZYRTEC) 10 MG tablet Take 10 mg by mouth daily as needed for allergies.   Yes Historical Provider, MD  levothyroxine (SYNTHROID, LEVOTHROID) 50 MCG tablet Take 50 mcg by mouth daily before breakfast.   Yes Historical Provider, MD  pantoprazole (PROTONIX) 20 MG tablet  Take 1 tablet (20 mg total) by mouth daily. 12/30/15   Antonietta Breach, PA-C  sucralfate (CARAFATE) 1 GM/10ML suspension Take 10 mLs (1 g total) by mouth 4 (four) times daily -  with meals and at bedtime. 12/30/15   Antonietta Breach, PA-C   BP 134/90 mmHg  Pulse 98  Temp(Src) 98.3 F (36.8 C) (Oral)  Resp 18  Ht 5\' 4"  (1.626 m)  Wt 68.947 kg  BMI 26.08 kg/m2  SpO2 98%   Physical Exam  Constitutional: She is oriented to person, place, and time. She appears well-developed and well-nourished. No distress.  Nontoxic appearing. Patient calm and in no distress.  HENT:  Head:  Normocephalic and atraumatic.  Eyes: Conjunctivae and EOM are normal. No scleral icterus.  Neck: Normal range of motion.  Cardiovascular: Normal rate, regular rhythm and intact distal pulses.   Pulmonary/Chest: Effort normal. No respiratory distress. She has no wheezes. She has no rales.  Respirations even and unlabored  Abdominal: Soft. She exhibits no distension. There is tenderness. There is no rebound and no guarding.  Soft abdomen with epigastric tenderness to palpation. Normoactive bowel sounds in all quadrants. No masses, guarding, or rigidity. No peritoneal signs. Negative Murphy's sign.  Musculoskeletal: Normal range of motion.  Neurological: She is alert and oriented to person, place, and time.  Skin: Skin is warm and dry. No rash noted. She is not diaphoretic. No erythema. No pallor.  Psychiatric: She has a normal mood and affect. Her behavior is normal.  Nursing note and vitals reviewed.   ED Course  Procedures   DIAGNOSTIC STUDIES: Oxygen Saturation is 100% on RA, normal by my interpretation.    COORDINATION OF CARE: 11:39 PM Discussed lab results with pt. Will administer GI cocktail and order US abdomen. Discussed treatment plan with pt at bedside and pt agreed to plan.  Labs Review Labs Reviewed  COMPREHENSIVE METABOLIC PANEL - Abnormal; Notable for the following:    ALT 13 (*)    All other components within normal limits  URINALYSIS, ROUTINE W REFLEX MICROSCOPIC (NOT AT [facility] - Abnormal; Notable for the following:    Hgb urine dipstick SMALL (*)    All other components within normal limits  CBC WITH DIFFERENTIAL/PLATELET - Abnormal; Notable for the following:    WBC 11.4 (*)    All other components within normal limits  URINE MICROSCOPIC-ADD ON - Abnormal; Notable for the following:    Squamous Epithelial / LPF 0-5 (*)    All other components within normal limits  LIPASE, BLOOD    Imaging Review US Abdomen Complete  12/30/2015  CLINICAL DATA:  Epigastric pain  EXAM: ABDOMEN ULTRASOUND COMPLETE COMPARISON:  None. FINDINGS: Gallbladder: No gallstones or wall thickening visualized. No sonographic Murphy sign noted by sonographer. Common bile duct: Diameter: 1.9 mm Liver: No focal lesion identified. Within normal limits in parenchymal echogenicity. IVC: No abnormality visualized. Pancreas: Not well seen Spleen: Size and appearance within normal limits. Right Kidney: Length: 11.1 cm. Echogenicity within normal limits. No mass or hydronephrosis visualized. Left Kidney: Length: 10.3 cm. Echogenicity within normal limits. No mass or hydronephrosis visualized. Abdominal aorta: No aneurysm visualized. Other findings: None. IMPRESSION: Nondiagnostic views of the pancreas. Normal liver, gallbladder and bile ducts. Electronically Signed   By: Andreas Newport M.D.   On: 12/30/2015 01:19   I have personally reviewed and evaluated these images and lab results as part of my medical decision-making.   EKG Interpretation None      1:53 AM Patient reports  symptom improvement with GI cocktail. [place] reassuring. Plan to d/c with Protonix and Carafate. Patient instructed to f/u with her GI doctor. She verbalizes comfort and understanding with plan.  MDM   Final diagnoses:  Epigastric pain  Peptic ulcer disease    50 year old female with a history of hyperlipidemia and hypothyroidism presents to the emergency department for evaluation of epigastric abdominal pain. Patient with mild tenderness to palpation in her epigastrium on exam. No signs of acute surgical abdomen. Patient is afebrile with a reassuring laboratory workup. Mild leukocytosis is consistent with prior testing. Suspect that this is the patient's baseline. Ultrasound ordered to evaluate for gallbladder etiology. Patient's liver, gallbladder, and bile ducts appear normal on ultrasound today.  Patient has had improvement in her symptoms after a GI cocktail. Her abdominal exam is stable. I suspect the patient's  symptoms to be secondary to peptic ulcer disease/gastritis. Patient is currently followed by gastroenterology and I have recommended that she follow-up with her gastroenterologist for further evaluation of her symptoms. Will start on PPI and Carafate for management. Return precautions discussed and provided. Patient discharged in satisfactory condition with no unaddressed concerns.  I personally performed the services described in this documentation, which was scribed in my presence. The recorded information has been reviewed and is accurate.    Filed Vitals:   12/29/15 2220 12/30/15 0038 12/30/15 0208  BP: 148/91 125/80 134/90  Pulse: 72 60 98  Temp: 98.3 F (36.8 C)    TempSrc: Oral    Resp: 15 17 18   Height: 5\' 4"  (1.626 m)    Weight: 68.947 kg    SpO2: 100% 97% 98%      Antonietta Breach, PA-C 12/30/15 [place], MD 01/01/16 2000

## 2015-12-30 MED ORDER — SUCRALFATE 1 GM/10ML PO SUSP: 1.0000 g | Freq: Three times a day (TID) | ORAL | Status: DC

## 2015-12-30 MED ORDER — PANTOPRAZOLE SODIUM 20 MG PO TBEC: 20.0000 mg | DELAYED_RELEASE_TABLET | Freq: Every day | ORAL | Status: DC

## 2015-12-30 NOTE — Discharge Instructions (Signed)
Peptic Ulcer A peptic ulcer is a sore in the lining of your esophagus (esophageal ulcer), stomach (gastric ulcer), or in the first part of your small intestine (duodenal ulcer). The ulcer causes erosion into the deeper tissue. CAUSES  Normally, the lining of the stomach and the small intestine protects itself from the acid that digests food. The protective lining can be damaged by:  An infection caused by a bacterium called Helicobacter pylori (H. pylori).  Regular use of nonsteroidal anti-inflammatory drugs (NSAIDs), such as ibuprofen or aspirin.  Smoking tobacco. Other risk factors include being older than 50, drinking alcohol excessively, and having a family history of ulcer disease.  SYMPTOMS   Burning pain or gnawing in the area between the chest and the belly button.  Heartburn.  Nausea and vomiting.  Bloating. The pain can be worse on an empty stomach and at night. If the ulcer results in bleeding, it can cause:  Black, tarry stools.  Vomiting of bright red blood.  Vomiting of coffee-ground-looking materials. DIAGNOSIS  A diagnosis is usually made based upon your history and an exam. Other tests and procedures may be performed to find the cause of the ulcer. Finding a cause will help determine the best treatment. Tests and procedures may include:  Blood tests, stool tests, or breath tests to check for the bacterium H. pylori.  An upper gastrointestinal (GI) series of the esophagus, stomach, and small intestine.  An endoscopy to examine the esophagus, stomach, and small intestine.  A biopsy. TREATMENT  Treatment may include:  Eliminating the cause of the ulcer, such as smoking, NSAIDs, or alcohol.  Medicines to reduce the amount of acid in your digestive tract.  Antibiotic medicines if the ulcer is caused by the H. pylori bacterium.  An upper endoscopy to treat a bleeding ulcer.  Surgery if the bleeding is severe or if the ulcer created a hole somewhere in the  digestive system. HOME CARE INSTRUCTIONS   Avoid tobacco, alcohol, and caffeine. Smoking can increase the acid in the stomach, and continued smoking will impair the healing of ulcers.  Avoid foods and drinks that seem to cause discomfort or aggravate your ulcer.  Only take medicines as directed by your caregiver. Do not substitute over-the-counter medicines for prescription medicines without talking to your caregiver.  Keep any follow-up appointments and tests as directed. SEEK MEDICAL CARE IF:   Your do not improve within 7 days of starting treatment.  You have ongoing indigestion or heartburn. SEEK IMMEDIATE MEDICAL CARE IF:   You have sudden, sharp, or persistent abdominal pain.  You have bloody or dark black, tarry stools.  You vomit blood or vomit that looks like coffee grounds.  You become light-headed, weak, or feel faint.  You become sweaty or clammy. MAKE SURE YOU:   Understand these instructions.  Will watch your condition.  Will get help right away if you are not doing well or get worse.   This information is not intended to replace advice given to you by your health care provider. Make sure you discuss any questions you have with your health care provider.   Document Released: 08/10/2000 Document Revised: 09/03/2014 Document Reviewed: 03/12/2012 Elsevier Interactive Patient Education 2016 [employer]  Food Choices for Peptic Ulcer Disease When you have peptic ulcer disease, the foods you eat and your eating habits are very important. Choosing the right foods can help ease the discomfort of peptic ulcer disease. WHAT GENERAL GUIDELINES DO I NEED TO FOLLOW?  Choose fruits, vegetables,   whole grains, and low-fat meat, fish, and poultry.   Keep a food diary to identify foods that cause symptoms.  Avoid foods that cause irritation or pain. These may be different for different people.  Eat frequent small meals instead of three large meals each day. The pain  may be worse when your stomach is empty.  Avoid eating close to bedtime. WHAT FOODS ARE NOT RECOMMENDED? The following are some foods and drinks that may worsen your symptoms:  Black, white, and red pepper.  Hot sauce.  Chili peppers.  Chili powder.  Chocolate and cocoa.   Alcohol.  Tea, coffee, and cola (regular and decaffeinated). The items listed above may not be a complete list of foods and beverages to avoid. Contact your dietitian for more information.   This information is not intended to replace advice given to you by your health care provider. Make sure you discuss any questions you have with your health care provider.   Document Released: 11/05/2011 Document Revised: 08/18/2013 Document Reviewed: 06/17/2013 Elsevier Interactive Patient Education 2016 Elsevier Inc.  

## 2016-12-17 ENCOUNTER — Other Ambulatory Visit (HOSPITAL_COMMUNITY): Payer: Self-pay | Admitting: Internal Medicine

## 2016-12-17 ENCOUNTER — Ambulatory Visit (HOSPITAL_COMMUNITY)
Admission: RE | Admit: 2016-12-17 | Discharge: 2016-12-17 | Disposition: A | Discharge status: Home / Self Care | Payer: BLUE CROSS/BLUE SHIELD | Source: Ambulatory Visit | Attending: Surgery | Admitting: Surgery

## 2016-12-17 DIAGNOSIS — M79662 Pain in left lower leg: Secondary | ICD-10-CM | POA: Diagnosis not present

## 2017-09-26 DIAGNOSIS — R87610 Atypical squamous cells of undetermined significance on cytologic smear of cervix (ASC-US): Secondary | ICD-10-CM | POA: Diagnosis not present

## 2017-10-06 ENCOUNTER — Ambulatory Visit: Payer: Self-pay | Admitting: Emergency Medicine

## 2017-10-06 VITALS — BP 112/78 | HR 99 | Temp 99.0°F | Resp 17

## 2017-10-06 DIAGNOSIS — J069 Acute upper respiratory infection, unspecified: Secondary | ICD-10-CM

## 2017-10-06 DIAGNOSIS — J029 Acute pharyngitis, unspecified: Secondary | ICD-10-CM

## 2017-10-06 MED ORDER — PREDNISONE 50 MG PO TABS: ORAL_TABLET | ORAL | 0 refills | Status: DC

## 2017-10-06 MED ORDER — MAGIC MOUTHWASH W/LIDOCAINE: 5.0000 mL | Freq: Three times a day (TID) | ORAL | 0 refills | Status: DC | PRN

## 2017-10-06 NOTE — Progress Notes (Signed)
Subjective:     Kathleen Luna is a 52 y.o. female who presents for evaluation of symptoms of a URI. Symptoms include achiness, congestion, fever-duration 1  day and sore throat. Onset of symptoms was 1 day ago, and has been unchanged since that time. Treatment to date: Alieve.  The following portions of the patient's history were reviewed and updated as appropriate: allergies and current medications.  Review of Systems Pertinent items noted in HPI and remainder of comprehensive ROS otherwise negative.   Objective:      Vitals:   10/06/17 1143  BP: 112/78  Pulse: 99  Resp: 17  Temp: 99 F (37.2 C)  SpO2: 98%   Physical Exam  Constitutional: She appears well-developed and well-nourished. No distress.  HENT:  Head: Normocephalic and atraumatic.  Right Ear: Tympanic membrane and external ear normal.  Left Ear: Tympanic membrane and external ear normal.  Nose: Nose normal.  Mouth/Throat: Uvula is midline. Oropharyngeal exudate present. Tonsils are 1+ on the right. Tonsils are 1+ on the left. Tonsillar exudate.  Neck: Normal range of motion. Neck supple.  Cardiovascular: Normal rate and regular rhythm.  Pulmonary/Chest: Effort normal and breath sounds normal.  Lymphadenopathy:    She has no cervical adenopathy.  Neurological: She is alert.  Skin: Skin is warm and dry. Capillary refill takes less than 2 seconds. She is not diaphoretic.  Nursing note and vitals reviewed.  RSS (-)  Assessment:    viral upper respiratory illness   Plan:    Discussed diagnosis and treatment of URI. Discussed the importance of avoiding unnecessary antibiotic therapy. Suggested symptomatic OTC remedies. Nasal saline spray for congestion. Follow up as needed. Call if symptoms persist past 4 days

## 2017-10-06 NOTE — Patient Instructions (Signed)

## 2017-10-07 LAB — POCT RAPID STREP A (OFFICE): Rapid Strep A Screen: NEGATIVE

## 2017-10-08 ENCOUNTER — Encounter: Payer: Self-pay | Admitting: Family

## 2017-10-08 ENCOUNTER — Ambulatory Visit: Payer: Self-pay | Admitting: Family

## 2017-10-08 VITALS — BP 125/85 | HR 74 | Temp 98.0°F | Resp 16 | Wt 167.8 lb

## 2017-10-08 DIAGNOSIS — J029 Acute pharyngitis, unspecified: Secondary | ICD-10-CM

## 2017-10-08 MED ORDER — FLUTICASONE PROPIONATE 50 MCG/ACT NA SUSP: 2.0000 | Freq: Every day | NASAL | 6 refills | Status: DC

## 2017-10-08 MED ORDER — AZITHROMYCIN 250 MG PO TABS: ORAL_TABLET | ORAL | 0 refills | Status: DC

## 2017-10-08 NOTE — Progress Notes (Signed)
   Subjective:    Patient ID: Kathleen Luna, female    DOB: Dec 04, 1965, 52 y.o.   MRN: [id number]  Pt presents to the office today with complaints of sore throat, fever, and cough. Pt was seen at the [facility] on 10/06/17 and had negative Strep and was started on prednisone. Pt states she is not feeling better at this time.  Sore Throat   This is a new problem. The current episode started in the past 7 days. The problem has been waxing and waning. Maximum temperature: 99. The pain is at a severity of 1/10. The pain is mild. Associated symptoms include congestion, coughing, ear pain, headaches, a hoarse voice, shortness of breath and trouble swallowing. Pertinent negatives include no ear discharge. She has tried gargles and acetaminophen for the symptoms. The treatment provided mild relief.      Review of Systems  HENT: Positive for congestion, ear pain, hoarse voice and trouble swallowing. Negative for ear discharge.   Respiratory: Positive for cough and shortness of breath.   Neurological: Positive for headaches.  All other systems reviewed and are negative.      Objective:   Physical Exam  Constitutional: She is oriented to person, place, and time. She appears well-developed and well-nourished. No distress.  HENT:  Head: Normocephalic and atraumatic.  Right Ear: External ear normal.  Left Ear: External ear normal.  Mouth/Throat: Posterior oropharyngeal erythema present.  Eyes: Pupils are equal, round, and reactive to light.  Neck: Normal range of motion. Neck supple. No thyromegaly present.  Cardiovascular: Normal rate, regular rhythm, normal heart sounds and intact distal pulses.  No murmur heard. Pulmonary/Chest: Effort normal and breath sounds normal. No respiratory distress. She has no wheezes.  Abdominal: Soft. Bowel sounds are normal. She exhibits no distension. There is no tenderness.  Musculoskeletal: Normal range of motion. She exhibits no edema or tenderness.   Neurological: She is alert and oriented to person, place, and time. She has normal reflexes. No cranial nerve deficit.  Skin: Skin is warm and dry.  Psychiatric: She has a normal mood and affect. Her behavior is normal. Judgment and thought content normal.  Vitals reviewed.     BP 125/85 (BP Location: Right Arm, Patient Position: Sitting, Cuff Size: Normal)   Pulse 74   Temp 98 F (36.7 C) (Oral)   Resp 16   Wt 167 lb 12.8 oz (76.1 kg)   SpO2 98%   BMI 28.80 kg/m      Assessment & Plan:  1. Acute pharyngitis, unspecified etiology - Take meds as prescribed - Use a cool mist humidifier  -Force fluids -For any cough or congestion  Use plain Mucinex- regular strength or max strength is fine -For fever or aces or pains- take tylenol or ibuprofen appropriate for age and weight. -Throat lozenges if help -New toothbrush in 3 days - azithromycin (ZITHROMAX Z-PAK) 250 MG tablet; As directed  Dispense: 1 each; Refill: 0 - fluticasone (FLONASE) 50 MCG/ACT nasal spray; Place 2 sprays into both nostrils daily.  Dispense: 16 g; Refill: [place], FNP

## 2017-10-08 NOTE — Patient Instructions (Signed)

## 2017-10-10 ENCOUNTER — Ambulatory Visit: Payer: Self-pay | Admitting: Emergency Medicine

## 2017-10-10 VITALS — BP 105/70 | HR 98 | Temp 98.7°F | Resp 16 | Wt 172.4 lb

## 2017-10-10 DIAGNOSIS — J069 Acute upper respiratory infection, unspecified: Secondary | ICD-10-CM

## 2017-10-10 MED ORDER — IPRATROPIUM BROMIDE 0.06 % NA SOLN: 2.0000 | Freq: Four times a day (QID) | NASAL | 0 refills | Status: DC

## 2017-10-10 MED ORDER — GUAIFENESIN-CODEINE 100-10 MG/5ML PO SOLN: 5.0000 mL | Freq: Four times a day (QID) | ORAL | 0 refills | Status: DC | PRN

## 2017-10-10 NOTE — Progress Notes (Signed)
S: Kathleen Luna is a 52 y.o. female who presents for follow up care for cough. She was seen here on 10/06/17 and then again on 10/08/17. She has been started on Azithromycin, Flonase, and prednisone. She is reporting cough, non-productive, with clear mucous. Her sore throat from 10/06/17 has improved. She does smoke, but reports smoking less while she has been ill. Denies fever or chills.  Review of Systems  Constitutional: Positive for malaise/fatigue. Negative for chills and fever.  HENT: Positive for congestion. Negative for ear pain.   Eyes: Negative.   Respiratory: Positive for cough and wheezing. Negative for shortness of breath.   Cardiovascular: Negative for chest pain and palpitations.  Gastrointestinal: Negative for diarrhea, nausea and vomiting.  Musculoskeletal: Negative for myalgias.  Neurological: Negative.    O:  Vitals:   10/10/17 1719  BP: 105/70  Pulse: 98  Resp: 16  Temp: 98.7 F (37.1 C)  SpO2: 98%   Physical Exam  Constitutional: She appears well-developed and well-nourished. No distress.  HENT:  Head: Normocephalic and atraumatic.  Right Ear: External ear normal.  Left Ear: External ear normal.  Nose: Nose normal.  Mouth/Throat: Oropharynx is clear and moist. No oropharyngeal exudate.  Eyes: Conjunctivae are normal.  Neck: Normal range of motion. Neck supple.  Cardiovascular: Normal rate and regular rhythm.  Pulmonary/Chest: Effort normal and breath sounds normal.  Lymphadenopathy:    She has no cervical adenopathy.  Neurological: She is alert.  Skin: Skin is warm and dry. Capillary refill takes less than 2 seconds. She is not diaphoretic.  Psychiatric: She has a normal mood and affect.  Nursing note and vitals reviewed.  A: 1. URI with cough and congestion    P: Likely etiology is viral, physical exam findings are reassuring along with history, Azithromycin is likely of little benefit however she may continue this medicine. Provided counseling to  the natural history of the illness, provided medicine for cough and rhinorrhea along with counseling on its use. Rest, fluids, antihistamines, Follow up as needed.

## 2017-10-10 NOTE — Patient Instructions (Signed)

## 2017-12-30 ENCOUNTER — Ambulatory Visit: Payer: Self-pay | Admitting: Family Medicine

## 2017-12-30 VITALS — BP 95/80 | HR 89 | Temp 98.2°F | Resp 16 | Wt 168.2 lb

## 2017-12-30 DIAGNOSIS — J309 Allergic rhinitis, unspecified: Secondary | ICD-10-CM

## 2017-12-30 DIAGNOSIS — Z8669 Personal history of other diseases of the nervous system and sense organs: Secondary | ICD-10-CM

## 2017-12-30 DIAGNOSIS — R6889 Other general symptoms and signs: Secondary | ICD-10-CM

## 2017-12-30 NOTE — Progress Notes (Signed)
Kathleen Luna is a 52 y.o. female who presents today with concerns of cough, cold, fatigue and general feeling of unwell for the last 48 hours. She reports recent travel to the beach over the last few days but denies interactions with any known sick contacts.  Review of Systems  Constitutional: Positive for chills and malaise/fatigue. Negative for fever.  HENT: Positive for sore throat. Negative for congestion, ear discharge, ear pain and sinus pain.   Eyes: Negative.   Respiratory: Positive for cough. Negative for sputum production and shortness of breath.   Cardiovascular: Negative.  Negative for chest pain.  Gastrointestinal: Negative for abdominal pain, diarrhea, nausea and vomiting.  Genitourinary: Negative for dysuria, frequency, hematuria and urgency.  Musculoskeletal: Negative for myalgias.  Skin: Negative.   Neurological: Negative for headaches.  Endo/Heme/Allergies: Negative.   Psychiatric/Behavioral: Negative.     O: Vitals:   12/30/17 1047  BP: 95/80  Pulse: 89  Resp: 16  Temp: 98.2 F (36.8 C)  SpO2: 97%     Physical Exam  Constitutional: She is oriented to person, place, and time. Vital signs are normal. She appears well-developed and well-nourished. She is active.  Non-toxic appearance. She does not have a sickly appearance. She appears ill.  Photophobia - patient wearing dark glasses  HENT:  Head: Normocephalic.  Right Ear: Hearing, tympanic membrane, external ear and ear canal normal.  Left Ear: Hearing, tympanic membrane, external ear and ear canal normal.  Nose: Nose normal.  Mouth/Throat: Uvula is midline and oropharynx is clear and moist.  Neck: Normal range of motion. Neck supple.  Cardiovascular: Normal rate, regular rhythm, normal heart sounds and normal pulses.  Pulmonary/Chest: Effort normal and breath sounds normal.  Abdominal: Soft. Bowel sounds are normal.  Musculoskeletal: Normal range of motion.  Lymphadenopathy:       Head (right side):  No submental and no submandibular adenopathy present.       Head (left side): No submental and no submandibular adenopathy present.    She has no cervical adenopathy.  Neurological: She is alert and oriented to person, place, and time.  Psychiatric: She has a normal mood and affect.  Vitals reviewed.  A: 1. Allergic rhinitis, unspecified seasonality, unspecified trigger   2. Flu-like symptoms     P: Discussed with patient PLAN< Rest at home x 48 hours Continue allergy regimen as directed. Start scheduled Tylenol 650 mg every 6-8 hours for next 48 hours Balanced Nutrition increased hydration F/U if symptoms change or become worse  Exam findings, diagnosis etiology and medication use and indications reviewed with patient. Follow- Up and discharge instructions provided. No emergent/urgent issues found on exam.  Patient verbalized understanding of information provided and agrees with plan of care (POC), all questions answered.  1. Allergic rhinitis, unspecified seasonality, unspecified trigger Continue treatment as discussed (Zyrtec/Flonase) 2. Flu-like symptoms Schedule tylenol x 48 hours and work note for home rest.

## 2017-12-30 NOTE — Patient Instructions (Addendum)
PLAN< Rest at home x 48 hours Continue allergy regimen as directed. Start scheduled Tylenol 650 mg every 6-8 hours for next 48 hours Balanced Nutrition increased hydration F/U if symptoms change or become worse  Allergic Rhinitis, Adult Allergic rhinitis is an allergic reaction that affects the mucous membrane inside the nose. It causes sneezing, a runny or stuffy nose, and the feeling of mucus going down the back of the throat (postnasal drip). Allergic rhinitis can be mild to severe. There are two types of allergic rhinitis:  Seasonal. This type is also called hay fever. It happens only during certain seasons.  Perennial. This type can happen at any time of the year.  What are the causes? This condition happens when the body's defense system (immune system) responds to certain harmless substances called allergens as though they were germs.  Seasonal allergic rhinitis is triggered by pollen, which can come from grasses, trees, and weeds. Perennial allergic rhinitis may be caused by:  House dust mites.  Pet dander.  Mold spores.  What are the signs or symptoms? Symptoms of this condition include:  Sneezing.  Runny or stuffy nose (nasal congestion).  Postnasal drip.  Itchy nose.  Tearing of the eyes.  Trouble sleeping.  Daytime sleepiness.  How is this diagnosed? This condition may be diagnosed based on:  Your medical history.  A physical exam.  Tests to check for related conditions, such as: ? Asthma. ? Pink eye. ? Ear infection. ? Upper respiratory infection.  Tests to find out which allergens trigger your symptoms. These may include skin or blood tests.  How is this treated? There is no cure for this condition, but treatment can help control symptoms. Treatment may include:  Taking medicines that block allergy symptoms, such as antihistamines. Medicine may be given as a shot, nasal spray, or pill.  Avoiding the allergen.  Desensitization. This  treatment involves getting ongoing shots until your body becomes less sensitive to the allergen. This treatment may be done if other treatments do not help.  If taking medicine and avoiding the allergen does not work, new, stronger medicines may be prescribed.  Follow these instructions at home:  Find out what you are allergic to. Common allergens include smoke, dust, and pollen.  Avoid the things you are allergic to. These are some things you can do to help avoid allergens: ? Replace carpet with wood, tile, or vinyl flooring. Carpet can trap dander and dust. ? Do not smoke. Do not allow smoking in your home. ? Change your heating and air conditioning filter at least once a month. ? During allergy season:  Keep windows closed as much as possible.  Plan outdoor activities when pollen counts are lowest. This is usually during the evening hours.  When coming indoors, change clothing and shower before sitting on furniture or bedding.  Take over-the-counter and prescription medicines only as told by your health care provider.  Keep all follow-up visits as told by your health care provider. This is important. Contact a health care provider if:  You have a fever.  You develop a persistent cough.  You make whistling sounds when you breathe (you wheeze).  Your symptoms interfere with your normal daily activities. Get help right away if:  You have shortness of breath. Summary  This condition can be managed by taking medicines as directed and avoiding allergens.  Contact your health care provider if you develop a persistent cough or fever.  During allergy season, keep windows closed as much as  possible. This information is not intended to replace advice given to you by your health care provider. Make sure you discuss any questions you have with your health care provider. Document Released: 05/08/2001 Document Revised: 09/20/2016 Document Reviewed: 09/20/2016 Elsevier Interactive  Patient Education  [employer].

## 2018-01-27 ENCOUNTER — Encounter: Payer: Self-pay | Admitting: Family Medicine

## 2018-01-27 ENCOUNTER — Ambulatory Visit: Payer: Self-pay | Admitting: Family Medicine

## 2018-01-27 VITALS — BP 110/80 | HR 84 | Temp 98.6°F | Ht 64.0 in | Wt 167.2 lb

## 2018-01-27 DIAGNOSIS — Z0000 Encounter for general adult medical examination without abnormal findings: Secondary | ICD-10-CM

## 2018-01-27 NOTE — Patient Instructions (Signed)

## 2018-01-27 NOTE — Progress Notes (Signed)
Kathleen Luna is a 52 y.o. female who presents today with concerns of need for an annual physical. She is under the care of her PCP who is internal Medicine and reports chronic medical conditions of Hypothyroidism treated with Synthroid. She denies any acute health concerns this visit.  Review of Systems  Constitutional: Negative for chills, fever and malaise/fatigue.  HENT: Negative for congestion, ear discharge, ear pain, sinus pain and sore throat.   Eyes: Negative.   Respiratory: Negative for cough, sputum production and shortness of breath.   Cardiovascular: Negative.  Negative for chest pain.  Gastrointestinal: Negative for abdominal pain, diarrhea, nausea and vomiting.  Genitourinary: Negative for dysuria, frequency, hematuria and urgency.  Musculoskeletal: Negative for myalgias.  Skin: Negative.   Neurological: Negative for headaches.  Endo/Heme/Allergies: Negative.   Psychiatric/Behavioral: Negative.     O: Vitals:   01/27/18 1723  BP: 110/80  Pulse: 84  Temp: 98.6 F (37 C)  SpO2: 97%     Physical Exam  Constitutional: She is oriented to person, place, and time. Vital signs are normal. She appears well-developed and well-nourished. She is active.  Non-toxic appearance. She does not have a sickly appearance.  HENT:  Head: Normocephalic.  Right Ear: Hearing, tympanic membrane, external ear and ear canal normal.  Left Ear: Hearing, tympanic membrane, external ear and ear canal normal.  Nose: Mucosal edema and rhinorrhea present.  Mouth/Throat: Uvula is midline and oropharynx is clear and moist.  Neck: Normal range of motion. Neck supple.  Cardiovascular: Normal rate, regular rhythm, normal heart sounds and normal pulses.  Pulmonary/Chest: Effort normal and breath sounds normal.  Abdominal: Soft. Bowel sounds are normal.  Musculoskeletal: Normal range of motion.  Lymphadenopathy:       Head (right side): No submental and no submandibular adenopathy present.   Head (left side): No submental and no submandibular adenopathy present.    She has no cervical adenopathy.  Neurological: She is alert and oriented to person, place, and time. She has normal strength. No cranial nerve deficit or sensory deficit. She displays a negative Romberg sign. GCS eye subscore is 4. GCS verbal subscore is 5. GCS motor subscore is 6.  Balance and strength intact Cognitive recall- WNL  Psychiatric: She has a normal mood and affect.  PHQ-9 - negative   Vitals reviewed.   A: 1. Physical exam     P: Exam WNL-no acute concerns. Exam findings, diagnosis etiology and medication use and indications reviewed with patient. Follow- Up and discharge instructions provided. No emergent/urgent issues found on exam.  Patient verbalized understanding of information provided and agrees with plan of care (POC), all questions answered.  1. Physical exam

## 2018-03-18 DIAGNOSIS — A084 Viral intestinal infection, unspecified: Secondary | ICD-10-CM | POA: Diagnosis not present

## 2018-03-18 DIAGNOSIS — R5383 Other fatigue: Secondary | ICD-10-CM | POA: Diagnosis not present

## 2018-03-18 DIAGNOSIS — Z6827 Body mass index (BMI) 27.0-27.9, adult: Secondary | ICD-10-CM | POA: Diagnosis not present

## 2018-03-18 DIAGNOSIS — R110 Nausea: Secondary | ICD-10-CM | POA: Diagnosis not present

## 2018-05-07 DIAGNOSIS — A084 Viral intestinal infection, unspecified: Secondary | ICD-10-CM | POA: Diagnosis not present

## 2018-05-08 DIAGNOSIS — M79642 Pain in left hand: Secondary | ICD-10-CM | POA: Diagnosis not present

## 2018-05-08 DIAGNOSIS — S61452A Open bite of left hand, initial encounter: Secondary | ICD-10-CM | POA: Diagnosis not present

## 2018-05-09 DIAGNOSIS — M79642 Pain in left hand: Secondary | ICD-10-CM | POA: Diagnosis not present

## 2018-05-12 DIAGNOSIS — M79642 Pain in left hand: Secondary | ICD-10-CM | POA: Diagnosis not present

## 2018-05-12 DIAGNOSIS — S61452D Open bite of left hand, subsequent encounter: Secondary | ICD-10-CM | POA: Diagnosis not present

## 2018-05-13 DIAGNOSIS — R5383 Other fatigue: Secondary | ICD-10-CM | POA: Diagnosis not present

## 2018-05-21 ENCOUNTER — Encounter (HOSPITAL_BASED_OUTPATIENT_CLINIC_OR_DEPARTMENT_OTHER): Payer: Self-pay | Admitting: Emergency Medicine

## 2018-05-21 ENCOUNTER — Other Ambulatory Visit: Payer: Self-pay

## 2018-05-21 ENCOUNTER — Emergency Department (HOSPITAL_BASED_OUTPATIENT_CLINIC_OR_DEPARTMENT_OTHER)
Admission: EM | Admit: 2018-05-21 | Discharge: 2018-05-21 | Disposition: A | Discharge status: Home / Self Care | Payer: 59 | Attending: Emergency Medicine | Admitting: Emergency Medicine

## 2018-05-21 ENCOUNTER — Emergency Department (HOSPITAL_BASED_OUTPATIENT_CLINIC_OR_DEPARTMENT_OTHER): Payer: 59

## 2018-05-21 DIAGNOSIS — S022XXA Fracture of nasal bones, initial encounter for closed fracture: Secondary | ICD-10-CM | POA: Diagnosis not present

## 2018-05-21 DIAGNOSIS — Y999 Unspecified external cause status: Secondary | ICD-10-CM | POA: Diagnosis not present

## 2018-05-21 DIAGNOSIS — Y9389 Activity, other specified: Secondary | ICD-10-CM | POA: Diagnosis not present

## 2018-05-21 DIAGNOSIS — S0990XA Unspecified injury of head, initial encounter: Secondary | ICD-10-CM | POA: Diagnosis not present

## 2018-05-21 DIAGNOSIS — F17210 Nicotine dependence, cigarettes, uncomplicated: Secondary | ICD-10-CM | POA: Diagnosis not present

## 2018-05-21 DIAGNOSIS — Z79899 Other long term (current) drug therapy: Secondary | ICD-10-CM | POA: Diagnosis not present

## 2018-05-21 DIAGNOSIS — W06XXXA Fall from bed, initial encounter: Secondary | ICD-10-CM | POA: Insufficient documentation

## 2018-05-21 DIAGNOSIS — I10 Essential (primary) hypertension: Secondary | ICD-10-CM | POA: Diagnosis not present

## 2018-05-21 DIAGNOSIS — Z7982 Long term (current) use of aspirin: Secondary | ICD-10-CM | POA: Insufficient documentation

## 2018-05-21 DIAGNOSIS — Y92013 Bedroom of single-family (private) house as the place of occurrence of the external cause: Secondary | ICD-10-CM | POA: Diagnosis not present

## 2018-05-21 DIAGNOSIS — S0011XA Contusion of right eyelid and periocular area, initial encounter: Secondary | ICD-10-CM | POA: Diagnosis not present

## 2018-05-21 DIAGNOSIS — S199XXA Unspecified injury of neck, initial encounter: Secondary | ICD-10-CM | POA: Diagnosis not present

## 2018-05-21 DIAGNOSIS — E039 Hypothyroidism, unspecified: Secondary | ICD-10-CM | POA: Insufficient documentation

## 2018-05-21 NOTE — ED Triage Notes (Signed)
Pt reports her feet got tangled in blankets when getting out of bed this am and she fell, hitting her face and body on the floor. approx golf ball sized hematoma noted to right forehead area, denies loc. Left elbow has raised area, dr. Venora Maples at bedside for exam.

## 2018-05-21 NOTE — ED Notes (Signed)
ED Provider at bedside. 

## 2018-05-24 NOTE — ED Provider Notes (Signed)
[facility] EMERGENCY DEPARTMENT Provider Note   CSN: [id number] Arrival date & time: 05/21/18  0749     History   Chief Complaint Chief Complaint  Patient presents with  . Fall  . Head Injury    HPI Kathleen Luna is a 52 y.o. female.  HPI Patient is a healthy 52 year old female presents to the emergency department after falling when getting out of bed this morning striking her face and body on the floor.  She presents the emergency department with complaints of headache as well as forehead hematoma.  Denies loss consciousness.  No use of anticoagulants.  Reports some mild discomfort with the left elbow but no pain with range of motion.  No use of anticoagulants.  Ports mild neck discomfort at this time without weakness of her arms or legs.  No chest or abdominal pain.  No recent illness.  Symptoms are mild to moderate in severity.  Past Medical History:  Diagnosis Date  . Headache(784.0)    otc med prn  . Hyperlipidemia   . Hypothyroidism   . SVD (spontaneous vaginal delivery)    x 1    Patient Active Problem List   Diagnosis Date Noted  . Abnormal EKG- septal Q waves 05/11/2014  . Dyslipidemia 05/11/2014  . Family history of coronary artery disease in father 05/11/2014  . Smoking 05/11/2014  . HTN (hypertension) 05/11/2014  . Situational stress 05/11/2014    Past Surgical History:  Procedure Laterality Date  . BREAST SURGERY  09/2010   augmentation  . VULVAR LESION REMOVAL  01/17/2012   Procedure: VULVAR LESION;  Surgeon: Allena Katz, MD;  Location: [facility];  Service: Gynecology;  Laterality: N/A;  incision and drainage  of perineal abscess  . WISDOM TOOTH EXTRACTION       OB History    Gravida  1   Para  1   Term  1   Preterm      AB      Living  1     SAB      TAB      Ectopic      Multiple      Live Births               Home Medications    Prior to Admission medications   Medication Sig Start Date End Date  Taking? Authorizing Provider  aspirin 81 MG tablet Take 81 mg by mouth daily.    [provider]  atorvastatin (LIPITOR) 40 MG tablet Take 40 mg by mouth every evening.     [provider]  azithromycin (ZITHROMAX Z-PAK) 250 MG tablet As directed Patient not taking: Reported on 12/30/2017 10/08/17   Sharion Balloon, FNP  cetirizine (ZYRTEC) 10 MG tablet Take 10 mg by mouth daily as needed for allergies.    [provider]  fluticasone (FLONASE) 50 MCG/ACT nasal spray Place 2 sprays into both nostrils daily. 10/08/17   Evelina Dun A, FNP  guaiFENesin-codeine 100-10 MG/5ML syrup Take 5 mLs by mouth every 6 (six) hours as needed for cough. Patient not taking: Reported on 12/30/2017 10/10/17   Barnet Glasgow, NP  ipratropium (ATROVENT) 0.06 % nasal spray Place 2 sprays into both nostrils 4 (four) times daily. 10/10/17   Barnet Glasgow, NP  levothyroxine (SYNTHROID, LEVOTHROID) 50 MCG tablet Take 50 mcg by mouth daily before breakfast.    [provider]  magic mouthwash w/lidocaine SOLN Take 5 mLs by mouth 3 (three) times  daily as needed for mouth pain. Patient not taking: Reported on 10/10/2017 10/06/17   Barnet Glasgow, NP  pantoprazole (PROTONIX) 20 MG tablet Take 1 tablet (20 mg total) by mouth daily. Patient not taking: Reported on 10/06/2017 12/30/15   Antonietta Breach, PA-C  predniSONE (DELTASONE) 50 MG tablet Take 1 tablet daily with food Patient not taking: Reported on 10/08/2017 10/06/17   Barnet Glasgow, NP    Family History Family History  Problem Relation Age of Onset  . Hyperlipidemia Mother   . Hypertension Mother   . Dementia Mother   . Early death Father   . Heart attack Father   . Cancer Brother   . Heart disease Sister     Social History Social History   Tobacco Use  . Smoking status: Current Every Day Smoker    Packs/day: 1.00    Years: 15.00    Pack years: 15.00    Types: Cigarettes    Last attempt to quit: 07/07/1997    Years  since quitting: 20.8  . Smokeless tobacco: Never Used  . Tobacco comment: RESTART SMOKING  Substance Use Topics  . Alcohol use: Yes    Comment: weekends  . Drug use: No     Allergies   Hydrocodone   Review of Systems Review of Systems  All other systems reviewed and are negative.    Physical Exam Updated Vital Signs BP (!) 157/92 (BP Location: Right Arm)   Pulse 66   Temp 97.9 F (36.6 C) (Oral)   Resp 16   Ht 5\' 4"  (1.626 m)   Wt 71.2 kg   SpO2 99%   BMI 26.95 kg/m   Physical Exam  Constitutional: She is oriented to person, place, and time. She appears well-developed and well-nourished. No distress.  HENT:  Head: Normocephalic.  Hematoma right forehead without evidence of laceration.  Mild tenderness over the nasal bridge without obvious deformity.  Tenderness of the inferior orbital rim on the right with some early signs of bruising  Eyes: EOM are normal.  Neck: Neck supple.  Mild cervical and paracervical tenderness without cervical step-off.  Cardiovascular: Normal rate, regular rhythm and normal heart sounds.  Pulmonary/Chest: Effort normal and breath sounds normal.  Abdominal: Soft. She exhibits no distension. There is no tenderness.  Musculoskeletal: Normal range of motion.  Neurological: She is alert and oriented to person, place, and time.  Skin: Skin is warm and dry.  Psychiatric: She has a normal mood and affect. Judgment normal.  Nursing note and vitals reviewed.    ED Treatments / Results  Labs (all labs ordered are listed, but only abnormal results are displayed) Labs Reviewed - No data to display  EKG None  Radiology No results found.  Procedures .Ortho Injury Treatment -Nasal Fracture Performed by: Jola Schmidt, MD Authorized by: Jola Schmidt, MD     Definitive Fracture Care  Definitive fracture care was performed for the patient's nasal fracture Treatment includes management of pain and ice Fracture related discharge  instructions were provided Symptomatic control measures provided to the patient   Medications Ordered in ED Medications - No data to display   Initial Impression / Assessment and Plan / ED Course  I have reviewed the triage vital signs and the nursing notes.  Pertinent labs & imaging results that were available during my care of the patient were reviewed by me and considered in my medical decision making (see chart for details).     Imaging demonstrates minimally displaced nasal fracture but no  other orbital or facial fractures.  Cervical spine is cleared by imaging.  No use of anticoagulants.  No vomiting.  Patient understands return to the ER for new or worsening symptoms.  Final Clinical Impressions(s) / ED Diagnoses   Final diagnoses:  Acute head injury, initial encounter  Closed fracture of nasal bone, initial encounter    ED Discharge Orders    None       Jola Schmidt, MD 05/24/18 [phone]

## 2018-05-26 DIAGNOSIS — S022XXD Fracture of nasal bones, subsequent encounter for fracture with routine healing: Secondary | ICD-10-CM | POA: Insufficient documentation

## 2018-05-26 DIAGNOSIS — S022XXA Fracture of nasal bones, initial encounter for closed fracture: Secondary | ICD-10-CM | POA: Diagnosis not present

## 2018-05-26 DIAGNOSIS — Z7289 Other problems related to lifestyle: Secondary | ICD-10-CM | POA: Diagnosis not present

## 2018-05-26 DIAGNOSIS — W1789XA Other fall from one level to another, initial encounter: Secondary | ICD-10-CM | POA: Diagnosis not present

## 2018-05-26 DIAGNOSIS — J343 Hypertrophy of nasal turbinates: Secondary | ICD-10-CM | POA: Diagnosis not present

## 2018-05-26 DIAGNOSIS — Z87891 Personal history of nicotine dependence: Secondary | ICD-10-CM | POA: Diagnosis not present

## 2018-05-26 DIAGNOSIS — W2209XA Striking against other stationary object, initial encounter: Secondary | ICD-10-CM | POA: Diagnosis not present

## 2018-05-26 HISTORY — DX: Fracture of nasal bones, subsequent encounter for fracture with routine healing: S02.2XXD

## 2018-06-19 DIAGNOSIS — Z6827 Body mass index (BMI) 27.0-27.9, adult: Secondary | ICD-10-CM | POA: Diagnosis not present

## 2018-06-19 DIAGNOSIS — Z01419 Encounter for gynecological examination (general) (routine) without abnormal findings: Secondary | ICD-10-CM | POA: Diagnosis not present

## 2018-06-19 DIAGNOSIS — Z1231 Encounter for screening mammogram for malignant neoplasm of breast: Secondary | ICD-10-CM | POA: Diagnosis not present

## 2018-08-10 ENCOUNTER — Ambulatory Visit: Payer: Self-pay | Admitting: Family Medicine

## 2018-08-10 VITALS — BP 122/78 | HR 82 | Temp 98.6°F | Resp 20 | Wt 162.4 lb

## 2018-08-10 DIAGNOSIS — M549 Dorsalgia, unspecified: Secondary | ICD-10-CM

## 2018-08-10 MED ORDER — LIDOCAINE 5 % EX PTCH: 1.0000 | MEDICATED_PATCH | CUTANEOUS | 0 refills | Status: DC

## 2018-08-10 MED ORDER — MELOXICAM 15 MG PO TABS: 15.0000 mg | ORAL_TABLET | Freq: Every day | ORAL | 0 refills | Status: DC

## 2018-08-10 MED ORDER — METHOCARBAMOL 750 MG PO TABS
750.0000 mg | ORAL_TABLET | Freq: Three times a day (TID) | ORAL | 0 refills | Status: AC | PRN
Start: 2018-08-10 — End: 2018-08-13

## 2018-08-10 MED ORDER — KETOROLAC TROMETHAMINE 60 MG/2ML IM SOLN
60.0000 mg | Freq: Once | INTRAMUSCULAR | Status: AC
  Administered 2018-08-10: 60 mg via INTRAMUSCULAR

## 2018-08-10 NOTE — Progress Notes (Signed)
Kathleen Luna is a 52 y.o. female who presents today with concerns of back pain for the last 24 hours. She reports a history of chronic back pain but has not had an exacerbation in many years. She additionally reports chronic tingling and numbness to the right side great toe. She denies bladder or bowel loss of control, trauma, recent travel, or physical exertion that is more than her normal. She reports a history of SI joint problems over 8 years ago. She denies any recent exacerbation over recent years or months.  Review of Systems  Constitutional: Negative for chills, fever and malaise/fatigue.  HENT: Negative for congestion, ear discharge, ear pain, sinus pain and sore throat.   Eyes: Negative.   Respiratory: Negative for cough, sputum production and shortness of breath.   Cardiovascular: Negative.  Negative for chest pain.  Gastrointestinal: Negative for abdominal pain, diarrhea, nausea and vomiting.  Genitourinary: Negative for dysuria, frequency, hematuria and urgency.  Musculoskeletal: Positive for back pain. Negative for myalgias.  Skin: Negative.   Neurological: Negative for headaches.  Endo/Heme/Allergies: Negative.   Psychiatric/Behavioral: Negative.     O: Vitals:   08/10/18 1149  BP: 122/78  Pulse: 82  Resp: 20  Temp: 98.6 F (37 C)  SpO2: 98%     Physical Exam Vitals signs reviewed.  Constitutional:      Appearance: She is well-developed. She is not toxic-appearing.  HENT:     Head: Normocephalic.     Right Ear: Hearing, tympanic membrane, ear canal and external ear normal.     Left Ear: Hearing, tympanic membrane, ear canal and external ear normal.     Nose: Nose normal.     Mouth/Throat:     Pharynx: Uvula midline.  Neck:     Musculoskeletal: Normal range of motion and neck supple.  Cardiovascular:     Rate and Rhythm: Normal rate and regular rhythm.     Pulses: Normal pulses.     Heart sounds: Normal heart sounds.  Pulmonary:     Effort:  Pulmonary effort is normal.     Breath sounds: Normal breath sounds.  Abdominal:     General: Bowel sounds are normal.     Palpations: Abdomen is soft.  Musculoskeletal: Normal range of motion.     Lumbar back: She exhibits tenderness, pain and spasm. She exhibits normal range of motion, no bony tenderness, no swelling, no edema, no deformity, no laceration and normal pulse.     Comments: Patient with complaint of pain with movement attempt at exam and special test limited and difficult due to patient pain. SLR negative, numbness/tingling not reproducible on exam. FABERS negative. No visual deformity, ecchymosis, edema.  Lymphadenopathy:     Head:     Right side of head: No submental or submandibular adenopathy.     Left side of head: No submental or submandibular adenopathy.     Cervical: No cervical adenopathy.  Neurological:     Mental Status: She is alert and oriented to person, place, and time.    A: 1. Acute back pain, unspecified back location, unspecified back pain laterality    P: Discussed exam findings, diagnosis etiology and medication use and indications reviewed with patient. Follow- Up and discharge instructions provided. No emergent/urgent issues found on exam.  Patient verbalized understanding of information provided and agrees with plan of care (POC), all questions answered.  Discussed typical course of atraumatic back pain with course of exacerbation limited to 4-6 weeks with or without conservative treatment. Discussed  treatment options to reduce pain and discomfort- discussed non pharmacologic treatment options of patches, creams, heat, epsom salt soaks. Discussed oral medication of muscle relaxants reviewed side effects, risk and side effects, discussed use of antiinflammatory and tylenol for pain. Patient has a sedentary occupation she is going to attempt to go to work on tomorrow.  1. Acute back pain, unspecified back location, unspecified back pain laterality -  ketorolac (TORADOL) injection 60 mg - meloxicam (MOBIC) 15 MG tablet; Take 1 tablet (15 mg total) by mouth daily. - methocarbamol (ROBAXIN-750) 750 MG tablet; Take 1 tablet (750 mg total) by mouth every 8 (eight) hours as needed for up to 3 days for muscle spasms. - lidocaine (LIDODERM) 5 %; Place 1 patch onto the skin daily. Remove & Discard patch within 12 hours or as directed by MD

## 2018-08-10 NOTE — Patient Instructions (Signed)

## 2018-08-11 DIAGNOSIS — M9904 Segmental and somatic dysfunction of sacral region: Secondary | ICD-10-CM

## 2018-08-11 DIAGNOSIS — M545 Low back pain: Secondary | ICD-10-CM | POA: Diagnosis not present

## 2018-08-11 HISTORY — DX: Segmental and somatic dysfunction of sacral region: M99.04

## 2018-08-13 DIAGNOSIS — M9904 Segmental and somatic dysfunction of sacral region: Secondary | ICD-10-CM | POA: Diagnosis not present

## 2018-08-18 ENCOUNTER — Other Ambulatory Visit: Payer: Self-pay | Admitting: Orthopedic Surgery

## 2018-08-18 DIAGNOSIS — M5441 Lumbago with sciatica, right side: Secondary | ICD-10-CM

## 2018-08-19 ENCOUNTER — Ambulatory Visit
Admission: RE | Admit: 2018-08-19 | Discharge: 2018-08-19 | Disposition: A | Discharge status: Home / Self Care | Payer: 59 | Source: Ambulatory Visit | Attending: Orthopedic Surgery | Admitting: Orthopedic Surgery

## 2018-08-19 DIAGNOSIS — M47816 Spondylosis without myelopathy or radiculopathy, lumbar region: Secondary | ICD-10-CM | POA: Diagnosis not present

## 2018-08-19 DIAGNOSIS — M5441 Lumbago with sciatica, right side: Secondary | ICD-10-CM

## 2018-08-19 DIAGNOSIS — M5126 Other intervertebral disc displacement, lumbar region: Secondary | ICD-10-CM | POA: Diagnosis not present

## 2018-08-19 DIAGNOSIS — M5127 Other intervertebral disc displacement, lumbosacral region: Secondary | ICD-10-CM | POA: Diagnosis not present

## 2018-08-21 ENCOUNTER — Other Ambulatory Visit: Payer: 59

## 2018-08-26 DIAGNOSIS — M9904 Segmental and somatic dysfunction of sacral region: Secondary | ICD-10-CM | POA: Diagnosis not present

## 2018-08-26 DIAGNOSIS — M4696 Unspecified inflammatory spondylopathy, lumbar region: Secondary | ICD-10-CM | POA: Diagnosis not present

## 2018-08-26 DIAGNOSIS — M47816 Spondylosis without myelopathy or radiculopathy, lumbar region: Secondary | ICD-10-CM

## 2018-08-26 HISTORY — DX: Spondylosis without myelopathy or radiculopathy, lumbar region: M47.816

## 2018-08-28 ENCOUNTER — Other Ambulatory Visit: Payer: Self-pay | Admitting: Orthopedic Surgery

## 2018-08-28 DIAGNOSIS — M4696 Unspecified inflammatory spondylopathy, lumbar region: Secondary | ICD-10-CM

## 2018-09-02 ENCOUNTER — Ambulatory Visit
Admission: RE | Admit: 2018-09-02 | Discharge: 2018-09-02 | Disposition: A | Discharge status: Home / Self Care | Payer: 59 | Source: Ambulatory Visit | Attending: Orthopedic Surgery | Admitting: Orthopedic Surgery

## 2018-09-02 ENCOUNTER — Other Ambulatory Visit: Payer: Self-pay | Admitting: Orthopedic Surgery

## 2018-09-02 DIAGNOSIS — M545 Low back pain: Secondary | ICD-10-CM | POA: Diagnosis not present

## 2018-09-02 DIAGNOSIS — M4696 Unspecified inflammatory spondylopathy, lumbar region: Secondary | ICD-10-CM

## 2018-09-02 MED ORDER — IOPAMIDOL (ISOVUE-M 200) INJECTION 41%
1.0000 mL | Freq: Once | INTRAMUSCULAR | Status: AC
  Administered 2018-09-02: 1 mL via INTRA_ARTICULAR

## 2018-09-02 MED ORDER — METHYLPREDNISOLONE ACETATE 40 MG/ML INJ SUSP (RADIOLOG
120.0000 mg | Freq: Once | INTRAMUSCULAR | Status: AC
  Administered 2018-09-02: 120 mg via INTRA_ARTICULAR

## 2018-09-02 NOTE — Discharge Instructions (Signed)

## 2018-09-12 DIAGNOSIS — H524 Presbyopia: Secondary | ICD-10-CM | POA: Diagnosis not present

## 2018-09-12 DIAGNOSIS — H5213 Myopia, bilateral: Secondary | ICD-10-CM | POA: Diagnosis not present

## 2018-10-24 DIAGNOSIS — Z0000 Encounter for general adult medical examination without abnormal findings: Secondary | ICD-10-CM | POA: Diagnosis not present

## 2018-10-24 DIAGNOSIS — E038 Other specified hypothyroidism: Secondary | ICD-10-CM | POA: Diagnosis not present

## 2018-10-29 ENCOUNTER — Other Ambulatory Visit: Payer: Self-pay | Admitting: Orthopedic Surgery

## 2018-10-29 DIAGNOSIS — M5136 Other intervertebral disc degeneration, lumbar region: Secondary | ICD-10-CM

## 2018-11-04 ENCOUNTER — Ambulatory Visit
Admission: RE | Admit: 2018-11-04 | Discharge: 2018-11-04 | Disposition: A | Discharge status: Home / Self Care | Payer: 59 | Source: Ambulatory Visit | Attending: Orthopedic Surgery | Admitting: Orthopedic Surgery

## 2018-11-04 DIAGNOSIS — M5136 Other intervertebral disc degeneration, lumbar region: Secondary | ICD-10-CM

## 2018-11-04 DIAGNOSIS — M4696 Unspecified inflammatory spondylopathy, lumbar region: Secondary | ICD-10-CM | POA: Diagnosis not present

## 2018-11-04 MED ORDER — METHYLPREDNISOLONE ACETATE 40 MG/ML INJ SUSP (RADIOLOG
120.0000 mg | Freq: Once | INTRAMUSCULAR | Status: AC
  Administered 2018-11-04: 120 mg via INTRA_ARTICULAR

## 2018-11-04 MED ORDER — IOPAMIDOL (ISOVUE-M 200) INJECTION 41%
1.0000 mL | Freq: Once | INTRAMUSCULAR | Status: AC
  Administered 2018-11-04: 1 mL via INTRA_ARTICULAR

## 2018-11-05 ENCOUNTER — Other Ambulatory Visit: Payer: 59

## 2018-11-05 DIAGNOSIS — E039 Hypothyroidism, unspecified: Secondary | ICD-10-CM | POA: Diagnosis not present

## 2018-11-05 DIAGNOSIS — R5383 Other fatigue: Secondary | ICD-10-CM | POA: Diagnosis not present

## 2018-11-05 DIAGNOSIS — Z6828 Body mass index (BMI) 28.0-28.9, adult: Secondary | ICD-10-CM | POA: Diagnosis not present

## 2018-11-05 DIAGNOSIS — E785 Hyperlipidemia, unspecified: Secondary | ICD-10-CM | POA: Diagnosis not present

## 2018-11-05 DIAGNOSIS — E559 Vitamin D deficiency, unspecified: Secondary | ICD-10-CM | POA: Diagnosis not present

## 2018-11-05 DIAGNOSIS — E038 Other specified hypothyroidism: Secondary | ICD-10-CM | POA: Diagnosis not present

## 2018-11-05 DIAGNOSIS — Z1331 Encounter for screening for depression: Secondary | ICD-10-CM | POA: Diagnosis not present

## 2018-11-05 DIAGNOSIS — M25551 Pain in right hip: Secondary | ICD-10-CM | POA: Diagnosis not present

## 2018-11-05 DIAGNOSIS — G4709 Other insomnia: Secondary | ICD-10-CM | POA: Diagnosis not present

## 2018-11-05 DIAGNOSIS — E7849 Other hyperlipidemia: Secondary | ICD-10-CM | POA: Diagnosis not present

## 2018-11-05 DIAGNOSIS — N951 Menopausal and female climacteric states: Secondary | ICD-10-CM | POA: Diagnosis not present

## 2018-11-05 DIAGNOSIS — R6882 Decreased libido: Secondary | ICD-10-CM | POA: Diagnosis not present

## 2018-11-05 DIAGNOSIS — Z131 Encounter for screening for diabetes mellitus: Secondary | ICD-10-CM | POA: Diagnosis not present

## 2018-11-05 DIAGNOSIS — Z0000 Encounter for general adult medical examination without abnormal findings: Secondary | ICD-10-CM | POA: Diagnosis not present

## 2018-11-26 ENCOUNTER — Ambulatory Visit (INDEPENDENT_AMBULATORY_CARE_PROVIDER_SITE_OTHER): Payer: 59 | Admitting: Internal Medicine

## 2018-11-27 ENCOUNTER — Ambulatory Visit (INDEPENDENT_AMBULATORY_CARE_PROVIDER_SITE_OTHER): Payer: 59 | Admitting: Internal Medicine

## 2018-11-27 DIAGNOSIS — E039 Hypothyroidism, unspecified: Secondary | ICD-10-CM | POA: Diagnosis not present

## 2018-11-27 DIAGNOSIS — R5383 Other fatigue: Secondary | ICD-10-CM | POA: Diagnosis not present

## 2018-11-27 DIAGNOSIS — E785 Hyperlipidemia, unspecified: Secondary | ICD-10-CM | POA: Diagnosis not present

## 2018-11-27 DIAGNOSIS — N951 Menopausal and female climacteric states: Secondary | ICD-10-CM | POA: Diagnosis not present

## 2018-12-19 DIAGNOSIS — M47816 Spondylosis without myelopathy or radiculopathy, lumbar region: Secondary | ICD-10-CM | POA: Diagnosis not present

## 2019-01-09 DIAGNOSIS — E039 Hypothyroidism, unspecified: Secondary | ICD-10-CM | POA: Diagnosis not present

## 2019-01-09 DIAGNOSIS — R6882 Decreased libido: Secondary | ICD-10-CM | POA: Diagnosis not present

## 2019-01-09 DIAGNOSIS — R5383 Other fatigue: Secondary | ICD-10-CM | POA: Diagnosis not present

## 2019-01-09 DIAGNOSIS — N951 Menopausal and female climacteric states: Secondary | ICD-10-CM | POA: Diagnosis not present

## 2019-02-25 ENCOUNTER — Other Ambulatory Visit: Payer: Self-pay

## 2019-02-25 ENCOUNTER — Inpatient Hospital Stay: Payer: 59 | Attending: Internal Medicine

## 2019-02-25 DIAGNOSIS — R5383 Other fatigue: Secondary | ICD-10-CM | POA: Diagnosis not present

## 2019-02-25 DIAGNOSIS — E559 Vitamin D deficiency, unspecified: Secondary | ICD-10-CM | POA: Diagnosis not present

## 2019-02-25 DIAGNOSIS — R6882 Decreased libido: Secondary | ICD-10-CM | POA: Diagnosis not present

## 2019-02-25 DIAGNOSIS — E785 Hyperlipidemia, unspecified: Secondary | ICD-10-CM | POA: Diagnosis not present

## 2019-02-25 DIAGNOSIS — E039 Hypothyroidism, unspecified: Secondary | ICD-10-CM | POA: Diagnosis not present

## 2019-02-25 DIAGNOSIS — N951 Menopausal and female climacteric states: Secondary | ICD-10-CM | POA: Diagnosis not present

## 2019-03-02 MED FILL — ATORVASTATIN 40 MG TABLET: 40 | 30 days supply | Qty: 30 | Fill #0

## 2019-03-02 MED FILL — ESTRADIOL 1 MG TABS: 1 | 30 days supply | Qty: 30 | Fill #0

## 2019-03-02 MED FILL — PROGESTERONE 200 MG CAPSULE: 200 | 30 days supply | Qty: 30 | Fill #0

## 2019-03-02 MED FILL — NP THYROID 90 MG TABLET: 90 | 30 days supply | Qty: 30 | Fill #0

## 2019-03-06 DIAGNOSIS — M545 Low back pain: Secondary | ICD-10-CM | POA: Diagnosis not present

## 2019-03-06 MED FILL — METHOCARBAMOL 500 MG TABLET: 500 | 30 days supply | Qty: 30 | Fill #0

## 2019-03-10 ENCOUNTER — Ambulatory Visit: Payer: 59 | Attending: Orthopedic Surgery | Admitting: Physical Therapy

## 2019-03-10 ENCOUNTER — Other Ambulatory Visit: Payer: Self-pay

## 2019-03-10 ENCOUNTER — Encounter: Payer: Self-pay | Admitting: Physical Therapy

## 2019-03-10 DIAGNOSIS — M5441 Lumbago with sciatica, right side: Secondary | ICD-10-CM | POA: Diagnosis not present

## 2019-03-10 DIAGNOSIS — G8929 Other chronic pain: Secondary | ICD-10-CM | POA: Insufficient documentation

## 2019-03-10 DIAGNOSIS — R29898 Other symptoms and signs involving the musculoskeletal system: Secondary | ICD-10-CM | POA: Diagnosis not present

## 2019-03-10 NOTE — Therapy (Signed)
[facility] [address]  [place], [place], [address] Phone: [phone]   Fax:  [phone]  Physical Therapy Evaluation  Patient Details  Name: HENRIETTE HESSER MRN: [id number] Date of Birth: 1966/05/21 Referring Provider (PT): Cleta Alberts, PA-C   Encounter Date: 03/10/2019  PT End of Session - 03/10/19 0846    Visit Number  1    Number of Visits  7    Date for PT Re-Evaluation  04/21/19    Authorization Type  [facility]    PT Start Time  0801    PT Stop Time  0837    PT Time Calculation (min)  36 min    Activity Tolerance  Patient tolerated treatment well;Patient limited by pain    Behavior During Therapy  [facility] for tasks assessed/performed       Past Medical History:  Diagnosis Date  . Headache(784.0)    otc med prn  . Hyperlipidemia   . Hypothyroidism   . SVD (spontaneous vaginal delivery)    x 1    Past Surgical History:  Procedure Laterality Date  . BREAST SURGERY  09/2010   augmentation  . VULVAR LESION REMOVAL  01/17/2012   Procedure: VULVAR LESION;  Surgeon: Allena Katz, MD;  Location: [facility];  Service: Gynecology;  Laterality: N/A;  incision and drainage  of perineal abscess  . WISDOM TOOTH EXTRACTION      There were no vitals filed for this visit.   Subjective Assessment - 03/10/19 0806    Subjective  Patient reports severe LBP starting in August 2019- fell off her bed head first, breaking her nose. Believes this exacerbated her back pain. Pain is located across B sides of LB, with L side worse. Notes occasional N/T in R digits 2-3. Worse in AM when getting up out of bed, getting up from prolonged sitting, picking up something from ground or bending over to put on pants. Injections have helped a little. Also noting some benefit with stretching, heat, Aspercreme, TENS.    Pertinent History  hypothyroidism, HLD    Limitations  Sitting;Lifting;Standing;Walking;House hold activities    How long  can you sit comfortably?  20-30 min    How long can you stand comfortably?  unlimited    How long can you walk comfortably?  unlimited    Diagnostic tests  08/19/18 lumbar MRI: At L5-S1 there is a minimal broad-based disc bulge. Mild bilateral facet arthropathy with a small left facet joint effusion and severe marrow edema of the left L4-5 facets. At L4-5 there is a minimal broad-based disc bulge with a shallow left foraminal disc protrusion    Patient Stated Goals  "no back pain"    Currently in Pain?  Yes    Pain Score  3     Pain Location  Back    Pain Orientation  Left    Pain Descriptors / Indicators  Dull;Aching    Pain Type  Chronic pain         [facility] PT Assessment - 03/10/19 0814      Assessment   Medical Diagnosis  LBP    Referring Provider (PT)  Cleta Alberts, PA-C    Onset Date/Surgical Date  03/27/18    Next MD Visit  not scheduled    Prior Therapy  no      Precautions   Precautions  None      Balance Screen   Has the patient fallen in the past  6 months  No    Has the patient had a decrease in activity level because of a fear of falling?   No    Is the patient reluctant to leave their home because of a fear of falling?   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    Leisure  working out- weightlifting and cardio      Cognition   Overall Cognitive Status  Within Functional Limits for tasks assessed      Observation/Other Assessments   Focus on Therapeutic Outcomes (FOTO)   Lumbar: 42 (58% limited, 41% predicted)      Sensation   Light Touch  Appears Intact      Coordination   Gross Motor Movements  are Fluid and Coordinated  Yes      Posture/Postural Control   Posture/Postural Control  Postural limitations    Postural Limitations  Rounded Shoulders;Forward head;Weight shift right      ROM / Strength   AROM / PROM / Strength  AROM;Strength      AROM   AROM Assessment Site  Lumbar    Lumbar Flexion  nearly to toes   moderate pain on return; heavy hip flexion reliance   Lumbar Extension  WFL    Lumbar - Right Side Bend  distal thigh   moderate pain in L LB   Lumbar - Left Side Bend  distal thigh    Lumbar - Right Rotation  mildly limited    Lumbar - Left Rotation  mildly limited      Strength   Strength Assessment Site  Hip;Knee;Ankle    Right/Left Hip  Right;Left    Right Hip Flexion  4/5   pain in anterior hip   Right Hip ABduction  4+/5    Right Hip ADduction  4+/5    Left Hip Flexion  4/5   pain in anterior hip   Left Hip ABduction  4+/5    Left Hip ADduction  4+/5    Right/Left Knee  Right;Left    Right Knee Flexion  4+/5    Right Knee Extension  4+/5    Left Knee Flexion  4+/5    Left Knee Extension  4+/5    Right/Left Ankle  Right;Left    Right Ankle Dorsiflexion  4+/5    Right Ankle Plantar Flexion  5/5    Left Ankle Dorsiflexion  4+/5    Left Ankle Plantar Flexion  4+/5      Flexibility   Soft Tissue Assessment /Muscle Length  yes    Hamstrings  mildly tight on L      Palpation   SI assessment   PSIS/ASIS symmetrical    Palpation comment  TTP over L PSIS       Special Tests    Special Tests  Lumbar    Lumbar Tests  Straight Leg Raise      Straight Leg Raise   Findings  Positive    Side   Left  Comment  L LBP      Ambulation/Gait   Assistive device  None    Gait Pattern  Step-through pattern;Decreased stance time - left;Decreased step length - right    Ambulation Surface  Level;Indoor    Gait velocity  WFL                Objective measurements completed on examination: See above findings.              PT Education -  03/10/19 0846    Education Details  prognosis, POC, HEP    Person(s) Educated  Patient    Methods  Explanation;Demonstration;Tactile cues;Verbal cues;Handout    Comprehension  Verbalized understanding;Returned demonstration       PT Short Term Goals - 03/10/19 5035      PT SHORT TERM GOAL #1   Title  Patient to be independent with initial HEP.    Time  3    Period  Weeks    Status  New    Target Date  03/31/19        PT Long Term Goals - 03/10/19 4656      PT LONG TERM GOAL #1   Title  Patient to be independent with advanced HEP.    Time  6    Period  Weeks    Status  New    Target Date  04/21/19      PT LONG TERM GOAL #2   Title  Patient to demonstrate [facility] and nonpainful lumbar AROM.    Time  6    Period  Weeks    Status  New    Target Date  04/21/19      PT LONG TERM GOAL #3   Title  Patient to report tolerance of 1.5 hours of desk work without pain limiting.    Time  6    Period  Weeks    Status  New    Target Date  04/21/19      PT LONG TERM GOAL #4   Title  Patient to report 50% improvement in pain levels with sit>stand and supine>sit transfers.    Time  6    Period  Weeks    Status  New    Target Date  04/21/19      PT LONG TERM GOAL #5   Title  Patient to return to fitness regimen  without pain limiting.    Time  6    Period  Weeks    Status  New    Target Date  04/21/19             Plan - 03/10/19 0847    Clinical Impression Statement  Patient is a 53y/o F presenting to OPPT with c/o chronic LBP, L > R, since August 2019. Notes occasional N/T in R digits 2-3. Worse in AM when getting up out of bed, getting up from prolonged sitting, picking up something from ground or bending over to put on pants. Patient today with painful and slightly limited lumbar AROM- relief with extension, decreased B hip flexion strength, positive L SLR, and tenderness over L PSIS. Educated patient on gentle extension-based HEP and advised not to push into pain.  Patient reported understanding. Would benefit from skilled PT services 1x/week for 6 weeks to address aforementioned impairments.    Personal Factors and Comorbidities  Age;Comorbidity 2;Past/Current Experience;Profession;Time since onset of injury/illness/exacerbation    Comorbidities  hypothyroidism, HLD    Examination-Activity Limitations  Sit;Bed Mobility;Sleep;Bend;Squat;Stairs;Carry;Dressing;Transfers;Lift  Examination-Participation Restrictions  Cleaning;Community Activity;Driving;Yard Work;Interpersonal Relationship;Laundry;Meal Prep    Stability/Clinical Decision Making  Stable/Uncomplicated    Clinical Decision Making  Low    Rehab Potential  Good    PT Frequency  1x / week    PT Duration  6 weeks    PT Treatment/Interventions  ADLs/Self Care Home Management;Cryotherapy;Electrical Stimulation;Moist Heat;Traction;Therapeutic exercise;Therapeutic activities;Functional mobility training;Stair training;Gait training;Ultrasound;Neuromuscular re-education;Patient/family education;Manual techniques;Taping;Energy conservation;Dry needling;Passive range of motion    PT Next Visit Plan  reassess HEP    Consulted and Agree with Plan of Care  Patient       Patient will benefit from skilled therapeutic intervention in order to improve the following deficits and impairments:  Hypomobility, Decreased activity tolerance, Decreased strength, Pain, Decreased range of motion, Improper body mechanics, Postural dysfunction, Impaired flexibility  Visit Diagnosis: 1. Chronic bilateral low back pain with right-sided sciatica   2. Other symptoms and signs involving the musculoskeletal system        Problem List Patient Active Problem List   Diagnosis Date Noted  . Abnormal EKG- septal Q waves 05/11/2014  . Dyslipidemia 05/11/2014  . Family history of coronary artery disease in father 05/11/2014  . Smoking 05/11/2014  . HTN (hypertension) 05/11/2014  . Situational stress 05/11/2014      Janene Harvey, PT, DPT 03/10/19 8:57 AM   [facility] [address]  [place], [place], [address] Phone: [phone]   Fax:  [phone]  Name: CALIAH KOPKE MRN: [id number] Date of Birth: 1965-09-09

## 2019-03-10 NOTE — Therapy (Deleted)
[facility] [address]  [place], [place], [address] Phone: [phone]   Fax:  [phone]  Physical Therapy Treatment  Patient Details  Name: SHANAI LARTIGUE MRN: [id number] Date of Birth: [birth year] Referring Provider (PT): Cleta Alberts, PA-C   Encounter Date: 03/10/2019  PT End of Session - 03/10/19 0846    Visit Number  1    Number of Visits  7    Date for PT Re-Evaluation  04/21/19    Authorization Type  [facility]    PT Start Time  0801    PT Stop Time  0837    PT Time Calculation (min)  36 min    Activity Tolerance  Patient tolerated treatment well;Patient limited by pain    Behavior During Therapy  [facility] for tasks assessed/performed       Past Medical History:  Diagnosis Date  . Headache(784.0)    otc med prn  . Hyperlipidemia   . Hypothyroidism   . SVD (spontaneous vaginal delivery)    x 1    Past Surgical History:  Procedure Laterality Date  . BREAST SURGERY  09/2010   augmentation  . VULVAR LESION REMOVAL  01/17/2012   Procedure: VULVAR LESION;  Surgeon: Allena Katz, MD;  Location: [facility];  Service: Gynecology;  Laterality: N/A;  incision and drainage  of perineal abscess  . WISDOM TOOTH EXTRACTION      There were no vitals filed for this visit.  Subjective Assessment - 03/10/19 0806    Subjective  Patient reports severe LBP starting in August 2019- fell off her bed head first, breaking her nose. Believes this exacerbated her back pain. Pain is located across B sides of LB, with L side worse. Notes occasional N/T in R digits 2-3. Worse in AM when getting up out of bed, getting up from prolonged sitting, picking up something from ground or bending over to put on pants. Injections have helped a little. Also noting some benefit with stretching, heat, Aspercreme, TENS.    Pertinent History  hypothyroidism, HLD    Limitations  Sitting;Lifting;Standing;Walking;House hold activities    How long can  you sit comfortably?  20-30 min    How long can you stand comfortably?  unlimited    How long can you walk comfortably?  unlimited    Diagnostic tests  08/19/18 lumbar MRI: At L5-S1 there is a minimal broad-based disc bulge. Mild bilateral facet arthropathy with a small left facet joint effusion and severe marrow edema of the left L4-5 facets. At L4-5 there is a minimal broad-based disc bulge with a shallow left foraminal disc protrusion    Patient Stated Goals  "no back pain"    Currently in Pain?  Yes    Pain Score  3     Pain Location  Back    Pain Orientation  Left    Pain Descriptors / Indicators  Dull;Aching    Pain Type  Chronic pain         [facility] PT Assessment - 03/10/19 0814      Assessment   Medical Diagnosis  LBP    Referring Provider (PT)  Cleta Alberts, PA-C    Onset Date/Surgical Date  03/27/18    Next MD Visit  not scheduled    Prior Therapy  no      Precautions   Precautions  None      Balance Screen   Has the patient fallen in the past 6  months  No    Has the patient had a decrease in activity level because of a fear of falling?   No    Is the patient reluctant to leave their home because of a fear of falling?   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    Leisure  working out- weightlifting and cardio      Cognition   Overall Cognitive Status  Within Functional Limits for tasks assessed      Observation/Other Assessments   Focus on Therapeutic Outcomes (FOTO)   Lumbar: 42 (58% limited, 41% predicted)      Sensation   Light Touch  Appears Intact      Coordination   Gross Motor Movements are  Fluid and Coordinated  Yes      Posture/Postural Control   Posture/Postural Control  Postural limitations    Postural Limitations  Rounded Shoulders;Forward head;Weight shift right      ROM / Strength   AROM / PROM / Strength  AROM;Strength      AROM   AROM Assessment Site  Lumbar    Lumbar Flexion  nearly to toes   moderate pain on return; heavy hip flexion reliance   Lumbar Extension  WFL    Lumbar - Right Side Bend  distal thigh   moderate pain in L LB   Lumbar - Left Side Bend  distal thigh    Lumbar - Right Rotation  mildly limited    Lumbar - Left Rotation  mildly limited      Strength   Strength Assessment Site  Hip;Knee;Ankle    Right/Left Hip  Right;Left    Right Hip Flexion  4/5   pain in anterior hip   Right Hip ABduction  4+/5    Right Hip ADduction  4+/5    Left Hip Flexion  4/5   pain in anterior hip   Left Hip ABduction  4+/5    Left Hip ADduction  4+/5    Right/Left Knee  Right;Left    Right Knee Flexion  4+/5    Right Knee Extension  4+/5    Left Knee Flexion  4+/5    Left Knee Extension  4+/5    Right/Left Ankle  Right;Left    Right Ankle Dorsiflexion  4+/5    Right Ankle Plantar Flexion  5/5    Left Ankle Dorsiflexion  4+/5    Left Ankle Plantar Flexion  4+/5      Flexibility   Soft Tissue Assessment /Muscle Length  yes    Hamstrings  mildly tight on L      Palpation   SI assessment   PSIS/ASIS symmetrical    Palpation comment  TTP over L PSIS       Special Tests    Special Tests  Lumbar    Lumbar Tests  Straight Leg Raise      Straight Leg Raise   Findings  Positive    Side   Left  Comment  L LBP      Ambulation/Gait   Assistive device  None    Gait Pattern  Step-through pattern;Decreased stance time - left;Decreased step length - right    Ambulation Surface  Level;Indoor    Gait velocity  [facility]                           PT Education - 03/10/19 0846    Education Details  prognosis, POC, HEP    Person(s)  Educated  Patient    Methods  Explanation;Demonstration;Tactile cues;Verbal cues;Handout    Comprehension  Verbalized understanding;Returned demonstration       PT Short Term Goals - 03/10/19 8527      PT SHORT TERM GOAL #1   Title  Patient to be independent with initial HEP.    Time  3    Period  Weeks    Status  New    Target Date  03/31/19        PT Long Term Goals - 03/10/19 7824      PT LONG TERM GOAL #1   Title  Patient to be independent with advanced HEP.    Time  6    Period  Weeks    Status  New    Target Date  04/21/19      PT LONG TERM GOAL #2   Title  Patient to demonstrate [facility] and nonpainful lumbar AROM.    Time  6    Period  Weeks    Status  New    Target Date  04/21/19      PT LONG TERM GOAL #3   Title  Patient to report tolerance of 1.5 hours of desk work without pain limiting.    Time  6    Period  Weeks    Status  New    Target Date  04/21/19      PT LONG TERM GOAL #4   Title  Patient to report 50% improvement in pain levels with sit>stand and supine>sit transfers.    Time  6    Period  Weeks    Status  New    Target Date  04/21/19      PT LONG TERM GOAL #5   Title  Patient to return to fitness regimen  without pain limiting.    Time  6    Period  Weeks    Status  New    Target Date  04/21/19            Plan - 03/10/19 0847    Clinical Impression Statement  Patient is a 53y/o F presenting to OPPT with c/o chronic LBP, L > R, since August 2019. Notes occasional N/T in R digits 2-3. Worse in AM when getting up out of bed, getting up from prolonged sitting, picking up something from ground or bending over to put on pants. Patient today with painful and slightly limited lumbar AROM- relief with extension, decreased B hip flexion strength, positive L SLR, and tenderness over L PSIS. Educated patient on gentle extension-based HEP and advised not to push into pain. Patient reported understanding. Would benefit from skilled PT services 1x/week  for 6 weeks to address aforementioned impairments.    Personal Factors and Comorbidities  Age;Comorbidity 2;Past/Current Experience;Profession;Time since onset of injury/illness/exacerbation    Comorbidities  hypothyroidism, HLD    Examination-Activity Limitations  Sit;Bed Mobility;Sleep;Bend;Squat;Stairs;Carry;Dressing;Transfers;Lift    Examination-Participation Restrictions  Cleaning;Community Activity;Driving;Yard Work;Interpersonal Relationship;Laundry;Meal Prep  Stability/Clinical Decision Making  Stable/Uncomplicated    Clinical Decision Making  Low    Rehab Potential  Good    PT Frequency  1x / week    PT Duration  6 weeks    PT Treatment/Interventions  ADLs/Self Care Home Management;Cryotherapy;Electrical Stimulation;Moist Heat;Traction;Therapeutic exercise;Therapeutic activities;Functional mobility training;Stair training;Gait training;Ultrasound;Neuromuscular re-education;Patient/family education;Manual techniques;Taping;Energy conservation;Dry needling;Passive range of motion    PT Next Visit Plan  reassess HEP    Consulted and Agree with Plan of Care  Patient       Patient will benefit from skilled therapeutic intervention in order to improve the following deficits and impairments:  Hypomobility, Decreased activity tolerance, Decreased strength, Pain, Decreased range of motion, Improper body mechanics, Postural dysfunction, Impaired flexibility  Visit Diagnosis: 1. Chronic bilateral low back pain with right-sided sciatica   2. Other symptoms and signs involving the musculoskeletal system        Problem List Patient Active Problem List   Diagnosis Date Noted  . Abnormal EKG- septal Q waves 05/11/2014  . Dyslipidemia 05/11/2014  . Family history of coronary artery disease in father 05/11/2014  . Smoking 05/11/2014  . HTN (hypertension) 05/11/2014  . Situational stress 05/11/2014    Manuela Neptune 03/10/2019, 8:56 AM  [facility] [address]  [place], [place], [address] Phone: [phone]   Fax:  [phone]  Name: AKESHA URESTI MRN: [id number] Date of Birth: [birth year]

## 2019-03-11 ENCOUNTER — Other Ambulatory Visit: Payer: Self-pay | Admitting: Orthopedic Surgery

## 2019-03-11 DIAGNOSIS — M545 Low back pain, unspecified: Secondary | ICD-10-CM

## 2019-03-11 DIAGNOSIS — G8929 Other chronic pain: Secondary | ICD-10-CM

## 2019-03-17 ENCOUNTER — Ambulatory Visit: Payer: 59 | Admitting: Physical Therapy

## 2019-03-24 ENCOUNTER — Ambulatory Visit: Payer: 59 | Admitting: Physical Therapy

## 2019-03-24 ENCOUNTER — Encounter: Payer: Self-pay | Admitting: Physical Therapy

## 2019-03-24 ENCOUNTER — Other Ambulatory Visit: Payer: Self-pay

## 2019-03-24 DIAGNOSIS — G8929 Other chronic pain: Secondary | ICD-10-CM | POA: Diagnosis not present

## 2019-03-24 DIAGNOSIS — M5441 Lumbago with sciatica, right side: Secondary | ICD-10-CM | POA: Diagnosis not present

## 2019-03-24 DIAGNOSIS — R29898 Other symptoms and signs involving the musculoskeletal system: Secondary | ICD-10-CM

## 2019-03-24 NOTE — Therapy (Addendum)
[facility] [address]  [place], [place], [address] Phone: [phone]   Fax:  [phone]  Physical Therapy Treatment  Patient Details  Name: Kathleen Luna MRN: [id number] Date of Birth: [birth year] Referring Provider (PT): Cleta Alberts, PA-C   Encounter Date: 03/24/2019  PT End of Session - 03/24/19 0842    Visit Number  2    Number of Visits  7    Date for PT Re-Evaluation  04/21/19    Authorization Type  [facility]    PT Start Time  0800    PT Stop Time  0838    PT Time Calculation (min)  38 min    Activity Tolerance  Patient tolerated treatment well    Behavior During Therapy  [facility] for tasks assessed/performed       Past Medical History:  Diagnosis Date  . Headache(784.0)    otc med prn  . Hyperlipidemia   . Hypothyroidism   . SVD (spontaneous vaginal delivery)    x 1    Past Surgical History:  Procedure Laterality Date  . BREAST SURGERY  09/2010   augmentation  . VULVAR LESION REMOVAL  01/17/2012   Procedure: VULVAR LESION;  Surgeon: Allena Katz, MD;  Location: [facility];  Service: Gynecology;  Laterality: N/A;  incision and drainage  of perineal abscess  . WISDOM TOOTH EXTRACTION      There were no vitals filed for this visit.  Subjective Assessment - 03/24/19 0802    Subjective  Feels like her back is feeling a bit better. No issues with HEP and has been sqimming. Planning to schedule an injection in August.    Pertinent History  hypothyroidism, HLD    Diagnostic tests  08/19/18 lumbar MRI: At L5-S1 there is a minimal broad-based disc bulge. Mild bilateral facet arthropathy with a small left facet joint effusion and severe marrow edema of the left L4-5 facets. At L4-5 there is a minimal broad-based disc bulge with a shallow left foraminal disc protrusion    Patient Stated Goals  "no back pain"    Currently in Pain?  Yes    Pain Score  3     Pain Location  Back    Pain Orientation  Left     Pain Descriptors / Indicators  Aching;Dull    Pain Type  Chronic pain                       [facility] Adult PT Treatment/Exercise - 03/24/19 0001      Exercises   Exercises  Lumbar;Knee/Hip      Lumbar Exercises: Stretches   Passive Hamstring Stretch  Right;Left;1 rep;30 seconds    Passive Hamstring Stretch Limitations  supine with strap    Piriformis Stretch  Right;Left;1 rep;30 seconds    Piriformis Stretch Limitations  KTOS    Figure 4 Stretch  1 rep;30 seconds;With overpressure;Supine    Figure 4 Stretch Limitations  each LE to tolerance      Lumbar Exercises: Aerobic   Nustep  L3 x 40mn (LEs only)      Lumbar Exercises: Supine   Pelvic Tilt  10 reps    Pelvic Tilt Limitations  good ROM    Bridge  10 reps    Bridge Limitations  mildly decreased ROM    Bridge with clamshell  10 reps   red TB above knees     Lumbar Exercises: Quadruped   Madcat/Old Horse  10 reps    Madcat/Old Horse Limitations  reporting relief    Other Quadruped Lumbar Exercises  child pose 10x5"   reporting relief     Knee/Hip Exercises: Standing   Functional Squat  1 set;10 reps    Functional Squat Limitations  advised to perform shallow squat   pain on return up   Wall Squat  1 set;10 reps    Wall Squat Limitations  no pain    Other Standing Knee Exercises  sidestepping with red TB around ankles 2x36f      Knee/Hip Exercises: Sidelying   Clams  x15 each LE   cues to avoid trunk rotation            PT Education - 03/24/19 0841    Education Details  update to HEP    Person(s) Educated  Patient    Methods  Explanation;Demonstration;Tactile cues;Verbal cues;Handout    Comprehension  Verbalized understanding;Returned demonstration       PT Short Term Goals - 03/24/19 0848      PT SHORT TERM GOAL #1   Title  Patient to be independent with initial HEP.    Time  3    Period  Weeks    Status  Achieved    Target Date  03/31/19        PT Long Term Goals - 03/24/19  0848      PT LONG TERM GOAL #1   Title  Patient to be independent with advanced HEP.    Time  6    Period  Weeks    Status  On-going      PT LONG TERM GOAL #2   Title  Patient to demonstrate [facility] nonpainful lumbar AROM.    Time  6    Period  Weeks    Status  On-going      PT LONG TERM GOAL #3   Title  Patient to report tolerance of 1.5 hours of desk work without pain limiting.    Time  6    Period  Weeks    Status  On-going      PT LONG TERM GOAL #4   Title  Patient to report 50% improvement in pain levels with sit>stand and supine>sit transfers.    Time  6    Period  Weeks    Status  On-going      PT LONG TERM GOAL #5   Title  Patient to return to fitness regimen  without pain limiting.    Time  6    Period  Weeks    Status  On-going            Plan - 03/24/19 02956   Clinical Impression Statement  Patient arrived to session with report of improvement in LBP since initial visit. Reviewed HEP, with patient demonstrating good form and carryover. Increased challenge with bridges with addition of banded resistance. Initiated clamshells with cues to avoid trunk rotation as compensation. Patient reporting significant relief with cat/cow and child's pose stretching. Updated and consolidated HEP with exercises that were well-tolerated. Patient reporting pain with squats, thus attempted mini squats and wall squats. Patient reporting L LBP with 90 degree squats, thus advised patient to perform more shallow squats which were better tolerated. Able to perform 90 degree wall squats without pain as patient remains pain-free with spine staying neutral. Patient without complaints at end of session- declined modalities.    Comorbidities  hypothyroidism, HLD    PT Treatment/Interventions  ADLs/Self  Care Home Management;Cryotherapy;Electrical Stimulation;Moist Heat;Traction;Therapeutic exercise;Therapeutic activities;Functional mobility training;Stair training;Gait  training;Ultrasound;Neuromuscular re-education;Patient/family education;Manual techniques;Taping;Energy conservation;Dry needling;Passive range of motion    PT Next Visit Plan  progress squats, lumbar extension exercises    Consulted and Agree with Plan of Care  Patient       Patient will benefit from skilled therapeutic intervention in order to improve the following deficits and impairments:  Hypomobility, Decreased activity tolerance, Decreased strength, Pain, Decreased range of motion, Improper body mechanics, Postural dysfunction, Impaired flexibility  Visit Diagnosis: 1. Chronic bilateral low back pain with right-sided sciatica   2. Other symptoms and signs involving the musculoskeletal system        Problem List Patient Active Problem List   Diagnosis Date Noted  . Abnormal EKG- septal Q waves 05/11/2014  . Dyslipidemia 05/11/2014  . Family history of coronary artery disease in father 05/11/2014  . Smoking 05/11/2014  . HTN (hypertension) 05/11/2014  . Situational stress 05/11/2014    Janene Harvey, PT, DPT 03/24/19 8:53 AM    [facility] [address]  [place], [place], [address] Phone: [phone]   Fax:  [phone]  Name: Kathleen Luna MRN: [id number] Date of Birth: 06/04/1966  PHYSICAL THERAPY DISCHARGE SUMMARY  Visits from Start of Care: 2  Current functional level related to goals / functional outcomes: Unable to assess; patient called to cx all appointments    Remaining deficits: Unable to assess   Education / Equipment: HEP  Plan: Patient agrees to discharge.  Patient goals were not met. Patient is being discharged due to not returning since the last visit.  ?????     Janene Harvey, PT, DPT 05/06/19 11:13 AM

## 2019-03-31 ENCOUNTER — Encounter: Payer: 59 | Admitting: Physical Therapy

## 2019-04-07 ENCOUNTER — Ambulatory Visit: Payer: 59

## 2019-04-13 MED FILL — ATORVASTATIN 40 MG TABLET: 40 | 30 days supply | Qty: 30 | Fill #1

## 2019-04-13 MED FILL — NP THYROID 90 MG TABLET: 90 | 30 days supply | Qty: 30 | Fill #1

## 2019-04-13 MED FILL — ESTRADIOL 1 MG TABS: 1 | 30 days supply | Qty: 30 | Fill #1

## 2019-04-21 ENCOUNTER — Encounter: Payer: 59 | Admitting: Physical Therapy

## 2019-04-27 MED FILL — PROGESTERONE 200 MG CAPSULE: 200 | 30 days supply | Qty: 30 | Fill #1

## 2019-05-19 MED FILL — NP THYROID 90 MG TABLET: 90 | 30 days supply | Qty: 30 | Fill #2

## 2019-05-20 MED FILL — ESTRADIOL 1 MG TABS: 1 | 30 days supply | Qty: 30 | Fill #2

## 2019-05-25 ENCOUNTER — Encounter (INDEPENDENT_AMBULATORY_CARE_PROVIDER_SITE_OTHER): Payer: Self-pay | Admitting: Internal Medicine

## 2019-05-27 MED FILL — ATORVASTATIN 40 MG TABLET: 40 | 30 days supply | Qty: 30 | Fill #2

## 2019-06-02 ENCOUNTER — Other Ambulatory Visit: Payer: Self-pay

## 2019-06-02 ENCOUNTER — Ambulatory Visit (INDEPENDENT_AMBULATORY_CARE_PROVIDER_SITE_OTHER): Payer: 59 | Admitting: Internal Medicine

## 2019-06-02 ENCOUNTER — Encounter (INDEPENDENT_AMBULATORY_CARE_PROVIDER_SITE_OTHER): Payer: Self-pay | Admitting: Internal Medicine

## 2019-06-02 VITALS — BP 140/80 | HR 72 | Ht 64.0 in | Wt 156.4 lb

## 2019-06-02 DIAGNOSIS — Z1159 Encounter for screening for other viral diseases: Secondary | ICD-10-CM

## 2019-06-02 DIAGNOSIS — E785 Hyperlipidemia, unspecified: Secondary | ICD-10-CM

## 2019-06-02 DIAGNOSIS — E039 Hypothyroidism, unspecified: Secondary | ICD-10-CM | POA: Diagnosis not present

## 2019-06-02 DIAGNOSIS — F17200 Nicotine dependence, unspecified, uncomplicated: Secondary | ICD-10-CM | POA: Diagnosis not present

## 2019-06-02 DIAGNOSIS — E559 Vitamin D deficiency, unspecified: Secondary | ICD-10-CM | POA: Diagnosis not present

## 2019-06-02 DIAGNOSIS — I10 Essential (primary) hypertension: Secondary | ICD-10-CM | POA: Diagnosis not present

## 2019-06-02 DIAGNOSIS — E2839 Other primary ovarian failure: Secondary | ICD-10-CM | POA: Insufficient documentation

## 2019-06-02 HISTORY — DX: Hypothyroidism, unspecified: E03.9

## 2019-06-02 HISTORY — DX: Vitamin D deficiency, unspecified: E55.9

## 2019-06-02 HISTORY — DX: Other primary ovarian failure: E28.39

## 2019-06-02 NOTE — Progress Notes (Signed)
Wellness Office Visit  Subjective:  Patient ID: Kathleen Luna, female    DOB: 03-23-1966  Age: 53 y.o. MRN: [id number]  CC: This lady comes in for follow-up of her multiple medical problems including hypothyroidism, hyperlipidemia, menopausal state, decreased libido and energy, vitamin D deficiency. HPI  She is doing very well.  She has started eating better with some fasting and is managed to lose weight.  She is also exercising on a regular basis as outlined below. She is tolerating desiccated NP thyroid for hypothyroidism without any problems. She is also tolerating the higher dose of estradiol and progesterone.  She is certainly sleeping much better since being on bioidentical hormone therapy. She is tolerating testosterone therapy once a week and it seems to have given her more energy and focus and concentration. Her hyperlipidemia was very significant and she continues to take statin therapy but she is concerned about the myalgia with atorvastatin.  She does not have a history of coronary artery disease or cerebrovascular disease, thankfully. Unfortunately, she still continues to smoke cigarettes and she is working hard to quit. Past Medical History:  Diagnosis Date  . Headache(784.0)    otc med prn  . Hyperlipidemia   . Hypothyroidism   . Hypothyroidism, adult 06/02/2019  . Primary ovarian failure 06/02/2019  . SVD (spontaneous vaginal delivery)    x 1  . Vitamin D deficiency disease 06/02/2019      Family History  Problem Relation Age of Onset  . Hyperlipidemia Mother   . Hypertension Mother   . Dementia Mother   . Early death Father   . Heart attack Father   . Cancer Brother   . 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     Current Meds  Medication Sig  . atorvastatin (LIPITOR) 40 MG tablet Take 40 mg by mouth every  evening.   . Cholecalciferol (VITAMIN D-3) 125 MCG (5000 UT) TABS Take 2 tablets by mouth daily.  Marland Kitchen estradiol (ESTRACE) 1 MG tablet Take 1 mg by mouth daily.  . NP THYROID 90 MG tablet Take 90 mg by mouth daily.  . progesterone (PROMETRIUM) 200 MG capsule Take 200 mg by mouth every evening.  . TESTOSTERONE CYPIONATE IM Inject 5 mg into the muscle once a week. COMPOUNDED     Nutrition  Intermittent fasting usually 16 to 17 hours every day and trying to eat more of a whole food plant-based diet but does tend to eat chicken. Sleep  Excellent sleep with 7 to 8 hours of uninterrupted sleep.  Exercise  She walks 30 minutes briskly every day and also does strength training on a regular basis throughout the week. Bio Identical Hormones Testosterone therapy is being used off label for symptoms of testosterone deficiency and benefits that it produces based on several studies.  These benefits include decreasing body fat, increasing in lean muscle mass and increasing in bone density.  There is improvement of memory, cognition.  There is improvement in exercise tolerance and endurance.  Testosterone therapy has also been shown to be protective against coronary artery disease, cerebrovascular disease, diabetes, hypertension and degenerative joint disease. I have discussed with the patient the FDA warnings regarding testosterone therapy, benefits and side effects and modes of administration as well as monitoring blood levels and side effects  on a regular basis The patient is agreeable that testosterone therapy  should be an integral part of his/her wellness,quality of life and prevention of chronic disease.  Micronized progesterone is being used in this patient for multiple benefits based on studies including protection against uterine cancer, breast cancer, osteoporosis and heart disease. The patient has been counseled regarding side effects, benefits and modes of administration. The patient is agreeable  that this therapy is an integral part of her wellness, quality of life and prevention of chronic disease.  Estradiol is being used in this patient for multiple benefits based on several studies including protection against heart disease, cerebrovascular disease, osteoporosis, colon cancer, Alzheimer's disease, macular degeneration and cataracts. The patient has been counseled regarding benefits and side effects and modes of administration. The patient is agreeable that this therapy is an integral to part of her wellness, quality of life and prevention of chronic disease.      Objective:   Today's Vitals: BP 140/80   Pulse 72   Ht 5\' 4"  (1.626 m)   Wt 156 lb 6.4 oz (70.9 kg)   LMP  (Approximate)   BMI 26.85 kg/m  Vitals with BMI 06/02/2019 11/04/2018 09/02/2018  Height 5\' 4"  - -  Weight 156 lbs 6 oz - -  BMI [id number] - -  Systolic [id number]  Diastolic 80 70 78  Pulse 72 74 62     Physical Exam    She looks systemically well.  She has lost a further 6 pounds in weight since the last time I saw her in the office.  Her systolic blood pressure is somewhat elevated today.  She is alert and orientated without any focal neurological signs.   Assessment   1. Essential hypertension   2. Dyslipidemia   3. Smoking   4. Hypothyroidism, adult   5. Vitamin D deficiency disease   6. Primary ovarian failure   7. Encounter for hepatitis C screening test for low risk patient       Tests ordered Orders Placed This Encounter  Procedures  . COMPLETE METABOLIC PANEL WITH GFR  . Estradiol  . Lipid panel  . T3, free  . TSH  . Progesterone  . VITAMIN D 25 Hydroxy (Vit-D Deficiency, Fractures)  . Testos,Total,Free and SHBG (Female)  . Hepatitis C antibody     Plan: 1. She will continue with antihypertensive therapy and her blood pressure is slightly elevated today but she will monitor this at home. 2. She will continue with statin therapy for hyperlipidemia and hopefully it is  improved since she has lost weight. 3. She continues to work on quitting smoking. 4. She will continue on desiccated NP thyroid and blood work will dictate whether we need to optimize this further. 5. She will continue with vitamin D3 supplementation and blood work will also determine whether we need to optimize this further. 6. She will continue with bioidentical hormone therapy for her menopausal state and we will see if we need to further optimize therapy. 7. We today discussed also upper body exercises with strength training including push-ups and pull-ups and I encouraged her to continue with exercise as well as trying to follow the nutritional plan that we had already set out. 8. Today I spent 40 minutes with this patient face-to-face, more than 50% of the time was involved in discussing her overall wellness, bioidentical hormone therapy, the need for optimizing possibly testosterone therapy and possible side effects in the future and also exercise above. 9. Follow-up in about 3 months for an annual physical exam and further  recommendations will depend on blood results.     Doree Albee, MD

## 2019-06-04 ENCOUNTER — Encounter (INDEPENDENT_AMBULATORY_CARE_PROVIDER_SITE_OTHER): Payer: Self-pay | Admitting: Internal Medicine

## 2019-06-04 MED FILL — PROGESTERONE 200 MG CAPSULE: 200 | 30 days supply | Qty: 30 | Fill #2

## 2019-06-04 NOTE — Telephone Encounter (Signed)
My chart message to you.

## 2019-06-05 LAB — COMPLETE METABOLIC PANEL WITH GFR
AG Ratio: 1.5 (calc) (ref 1.0–2.5)
ALT: 18 U/L (ref 6–29)
AST: 23 U/L (ref 10–35)
Albumin: 4.3 g/dL (ref 3.6–5.1)
Alkaline phosphatase (APISO): 82 U/L (ref 37–153)
BUN/Creatinine Ratio: 26 (calc) — ABNORMAL HIGH (ref 6–22)
BUN: 12 mg/dL (ref 7–25)
CO2: 28 mmol/L (ref 20–32)
Calcium: 10.3 mg/dL (ref 8.6–10.4)
Chloride: 102 mmol/L (ref 98–110)
Creat: 0.47 mg/dL — ABNORMAL LOW (ref 0.50–1.05)
GFR, Est African American: 129 mL/min/{1.73_m2} (ref >=60)
GFR, Est Non African American: 111 mL/min/{1.73_m2} (ref >=60)
Globulin: 2.8 g/dL (calc) (ref 1.9–3.7)
Glucose, Bld: 92 mg/dL (ref 65–99)
Potassium: 4.4 mmol/L (ref 3.5–5.3)
Sodium: 137 mmol/L (ref 135–146)
Total Bilirubin: 0.6 mg/dL (ref 0.2–1.2)
Total Protein: 7.1 g/dL (ref 6.1–8.1)

## 2019-06-05 LAB — TESTOS,TOTAL,FREE AND SHBG (FEMALE)
Free Testosterone: 7.9 pg/mL — ABNORMAL HIGH (ref 0.1–6.4)
Sex Hormone Binding: 123 nmol/L (ref 17–124)
Testosterone, Total, LC-MS-MS: 72 ng/dL — ABNORMAL HIGH (ref 2–45)

## 2019-06-05 LAB — LIPID PANEL
Cholesterol: 177 mg/dL (ref <200)
HDL: 38 mg/dL — ABNORMAL LOW (ref >=50)
LDL Cholesterol (Calc): 118 mg/dL (calc) — ABNORMAL HIGH
Non-HDL Cholesterol (Calc): 139 mg/dL (calc) — ABNORMAL HIGH (ref <130)
Total CHOL/HDL Ratio: 4.7 (calc) (ref <5.0)
Triglycerides: 105 mg/dL (ref <150)

## 2019-06-05 LAB — HEPATITIS C ANTIBODY
Hepatitis C Ab: NONREACTIVE
SIGNAL TO CUT-OFF: 0.09 (ref <1.00)

## 2019-06-05 LAB — VITAMIN D 25 HYDROXY (VIT D DEFICIENCY, FRACTURES): Vit D, 25-Hydroxy: 52 ng/mL (ref 30–100)

## 2019-06-05 LAB — T3, FREE: T3, Free: 6 pg/mL — ABNORMAL HIGH (ref 2.3–4.2)

## 2019-06-05 LAB — PROGESTERONE: Progesterone: 12 ng/mL

## 2019-06-05 LAB — ESTRADIOL: Estradiol: 28 pg/mL

## 2019-06-05 LAB — TSH: TSH: 0.01 mIU/L — ABNORMAL LOW

## 2019-06-16 MED FILL — NP THYROID 90 MG TABLET: 90 | 30 days supply | Qty: 30 | Fill #3

## 2019-06-16 MED FILL — ESTRADIOL 1 MG TABS: 1 | 30 days supply | Qty: 30 | Fill #3

## 2019-06-25 DIAGNOSIS — Z1231 Encounter for screening mammogram for malignant neoplasm of breast: Secondary | ICD-10-CM | POA: Diagnosis not present

## 2019-06-25 DIAGNOSIS — Z87410 Personal history of cervical dysplasia: Secondary | ICD-10-CM | POA: Diagnosis not present

## 2019-06-25 DIAGNOSIS — Z01419 Encounter for gynecological examination (general) (routine) without abnormal findings: Secondary | ICD-10-CM | POA: Diagnosis not present

## 2019-06-25 DIAGNOSIS — Z6826 Body mass index (BMI) 26.0-26.9, adult: Secondary | ICD-10-CM | POA: Diagnosis not present

## 2019-07-01 MED FILL — FLUCONAZOLE 150 MG TABLET: 150 | 1 days supply | Qty: 1 | Fill #0

## 2019-07-01 MED FILL — ATORVASTATIN 40 MG TABLET: 40 | 30 days supply | Qty: 30 | Fill #3

## 2019-07-09 MED FILL — PROGESTERONE MICRONIZED 200: 200 | 30 days supply | Qty: 30 | Fill #3

## 2019-07-10 ENCOUNTER — Other Ambulatory Visit (INDEPENDENT_AMBULATORY_CARE_PROVIDER_SITE_OTHER): Payer: Self-pay | Admitting: Internal Medicine

## 2019-07-10 MED FILL — ESTRADIOL 1 MG TABS: 1 | 30 days supply | Qty: 30 | Fill #0

## 2019-07-20 ENCOUNTER — Other Ambulatory Visit (INDEPENDENT_AMBULATORY_CARE_PROVIDER_SITE_OTHER): Payer: Self-pay | Admitting: Internal Medicine

## 2019-07-20 MED FILL — NP THYROID 90 MG TABLET: 90 | 30 days supply | Qty: 30 | Fill #0

## 2019-08-10 ENCOUNTER — Other Ambulatory Visit (INDEPENDENT_AMBULATORY_CARE_PROVIDER_SITE_OTHER): Payer: Self-pay | Admitting: Internal Medicine

## 2019-08-10 MED FILL — ATORVASTATIN 40 MG TABLET: 40 | 30 days supply | Qty: 30 | Fill #0

## 2019-08-10 MED FILL — ESTRADIOL 1 MG TABS: 1 | 30 days supply | Qty: 30 | Fill #1

## 2019-08-10 MED FILL — PROGESTERONE MICRONIZED 200: 200 | 30 days supply | Qty: 30 | Fill #0

## 2019-08-24 IMAGING — CT CT CERVICAL SPINE W/O CM
5 of 8 series · 12 of 33 positions shown, 13 images · non-contrast
Comparison: None.

CLINICAL DATA: Status post fall out of bed this morning with
contusion above the right eye.

EXAM:
CT MAXILLOFACIAL WITHOUT CONTRAST
CT CERVICAL SPINE WITHOUT CONTRAST
TECHNIQUE: Multidetector CT imaging of the maxillofacial structures was
performed. Multiplanar CT image reconstructions were also generated.
A small metallic BB was placed on the right temple in order to
reliably differentiate right from left.
Multidetector CT imaging of the cervical spine was performed without
intravenous contrast. Multiplanar CT image reconstructions were also
generated.

[Series 2: max soft · axial · 0.30mm/px · z∈[-186,-130]mm · 2 of 85 slices shown]
[im 29/85  soft-tissue]
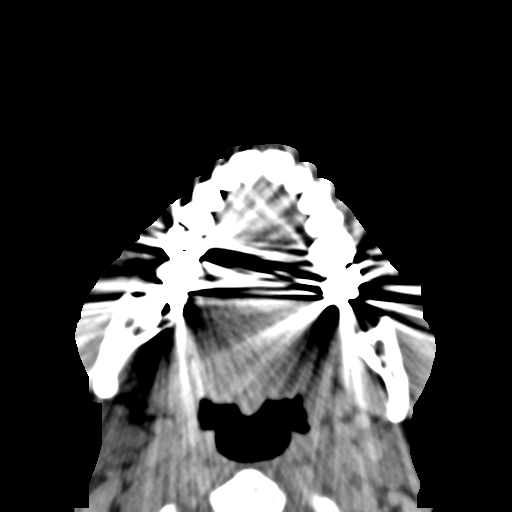
[im 57/85  soft-tissue]
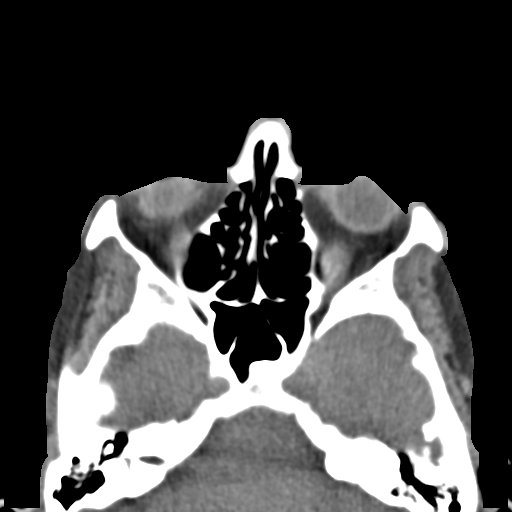

[Series 4: coronal soft · coronal · 0.32mm/px · 2 of 70 slices shown]
[im 24/70  bone]
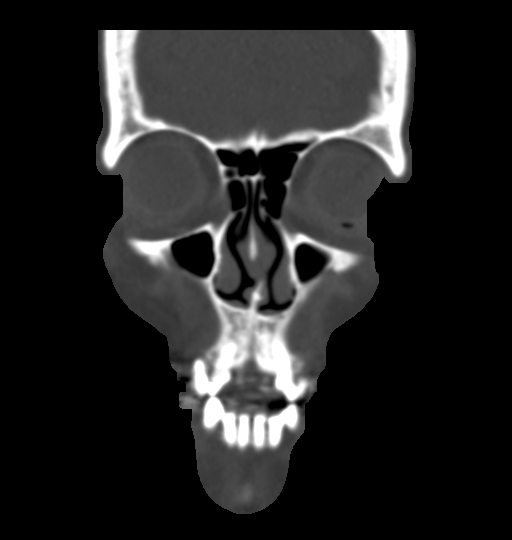
[im 47/70  bone]
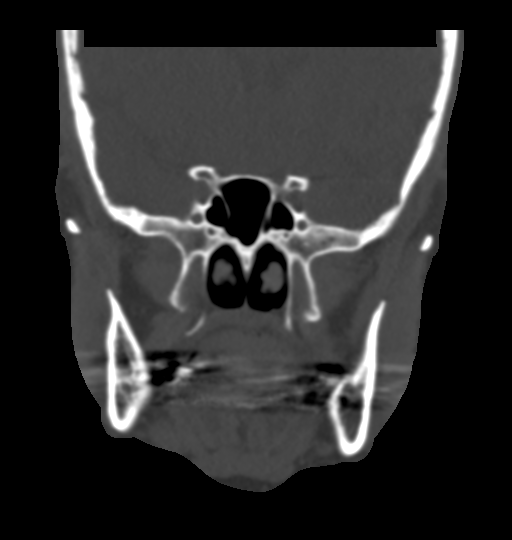

[Series 7: sagittal bone · sagittal · 0.27mm/px · 2 of 72 slices shown]
[im 24/72  bone]
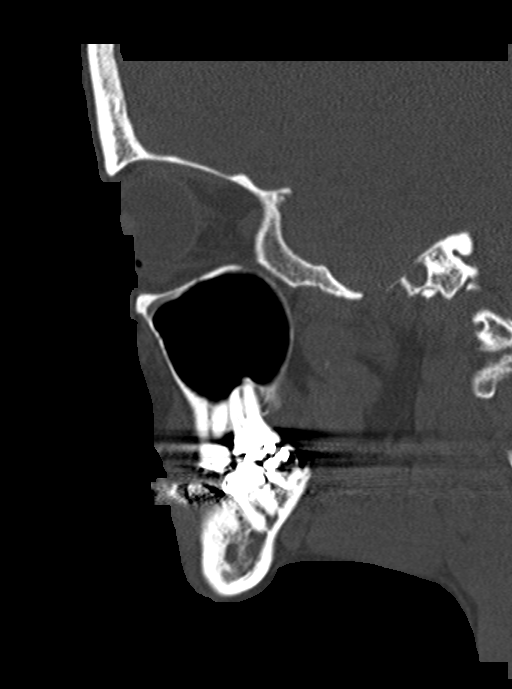
[im 48/72  bone]
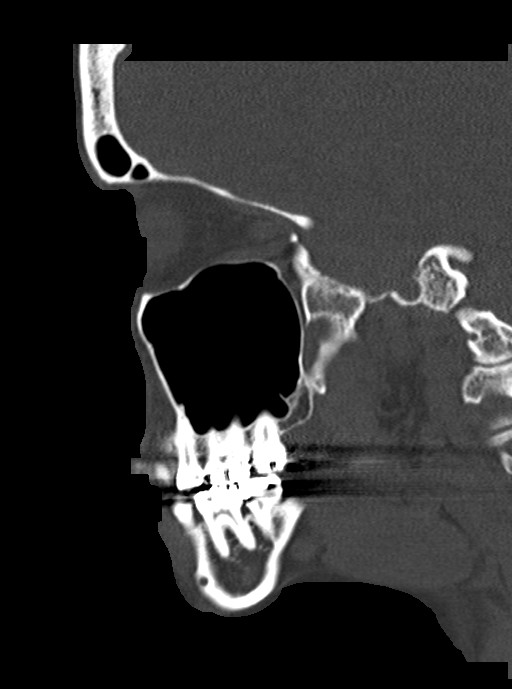

[Series 11: c spine soft · axial · 0.35mm/px · z∈[-245,-191]mm · 2 of 81 slices shown]
[im 27/81  soft-tissue]
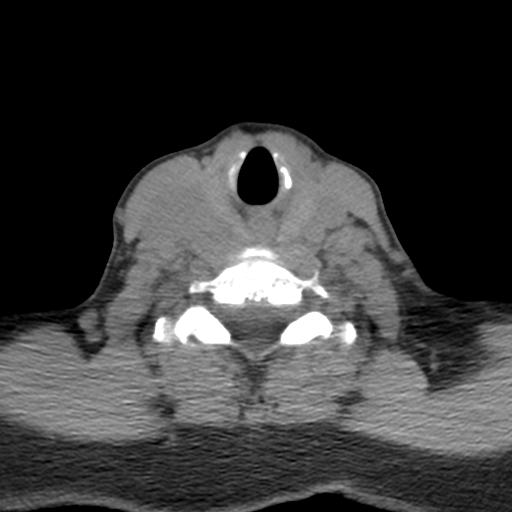
[im 54/81  soft-tissue]
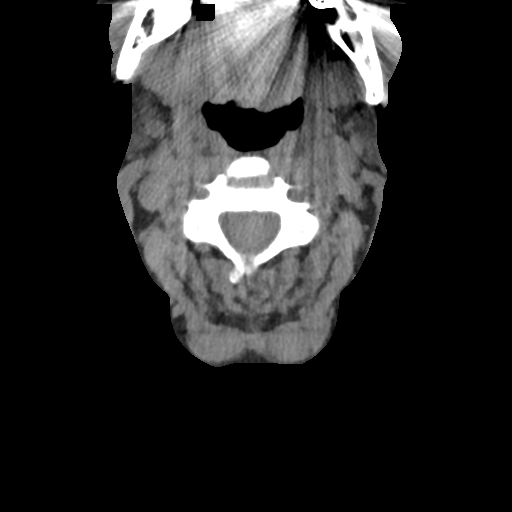

[Series 14: orthogonal axials · axial · 0.27mm/px · z∈[-317,-198]mm · 4 of 115 slices shown, 5 images]
[im 23/115  soft-tissue]
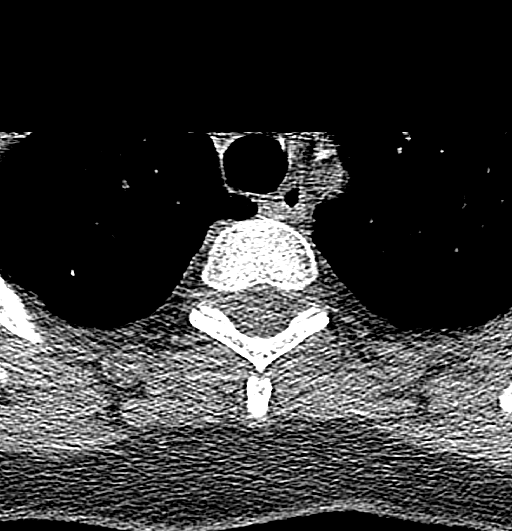
[im 23/115  bone]
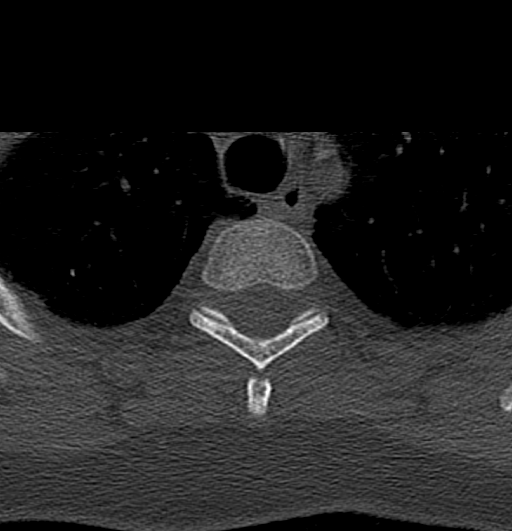
[im 46/115  bone]
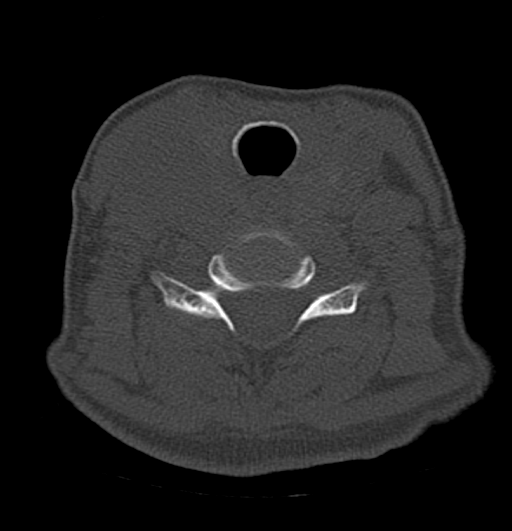
[im 69/115  bone]
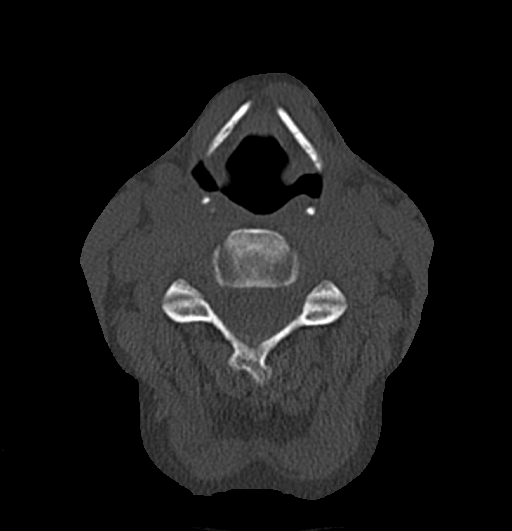
[im 92/115  bone]
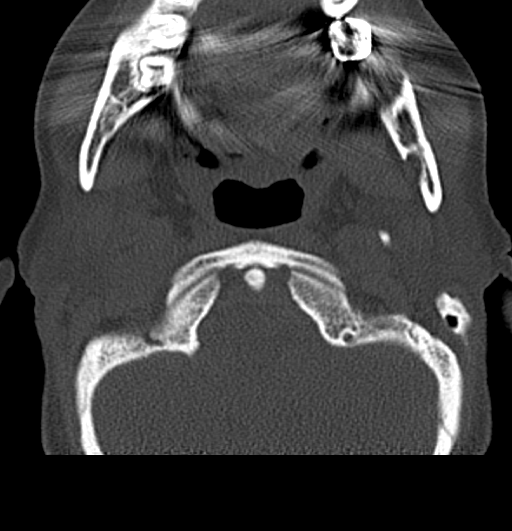

[12 of 33 positions shown; findings below may reference images not displayed]

FINDINGS: CT MAXILLOFACIAL FINDINGS

Osseous: There is mild displaced fracture of the left nasal bone. No
other acute displaced fracture dislocation is identified in the
maxillofacial bone.

Orbits: Negative. No traumatic or inflammatory finding.

Sinuses: Clear.

Soft tissues: There is soft tissue swelling in the right frontal
scalp.

Limited intracranial: No significant or unexpected finding.

CT CERVICAL FINDINGS

Alignment: There is straightening of cervical spine either due to
muscle spasm or positional.

Skull base and vertebrae: No acute fracture. No primary bone lesion
or focal pathologic process.

Soft tissues and spinal canal: No prevertebral fluid or swelling. No
visible canal hematoma.

Disc levels: Minimal anterior osteophytosis is identified in the mid
cervical spine. Mild decreased intervertebral spaces are identified
in the mid cervical spine.

Upper chest: There is a small calcified granuloma in the left apex.

Other: None.
IMPRESSION: Mild displaced fracture of the left nasal bone. No other acute
fracture dislocation is identified. There is right frontal scalp
swelling.

## 2019-08-31 MED FILL — NP THYROID 90 MG TABLET: 90 | 30 days supply | Qty: 30 | Fill #1

## 2019-09-07 MED FILL — ATORVASTATIN 40 MG TABLET: 40 | 30 days supply | Qty: 30 | Fill #1

## 2019-09-14 MED FILL — ESTRADIOL 1 MG TABS: 1 | 30 days supply | Qty: 30 | Fill #2

## 2019-09-14 MED FILL — PROGESTERONE MICRONIZED 200: 200 | 30 days supply | Qty: 30 | Fill #1

## 2019-09-15 ENCOUNTER — Encounter (INDEPENDENT_AMBULATORY_CARE_PROVIDER_SITE_OTHER): Payer: 59 | Admitting: Internal Medicine

## 2019-10-03 ENCOUNTER — Ambulatory Visit (INDEPENDENT_AMBULATORY_CARE_PROVIDER_SITE_OTHER)
Admission: RE | Admit: 2019-10-03 | Discharge: 2019-10-03 | Disposition: A | Discharge status: Home / Self Care | Payer: 59 | Source: Ambulatory Visit

## 2019-10-03 DIAGNOSIS — S0512XA Contusion of eyeball and orbital tissues, left eye, initial encounter: Secondary | ICD-10-CM

## 2019-10-03 DIAGNOSIS — W51XXXA Accidental striking against or bumped into by another person, initial encounter: Secondary | ICD-10-CM | POA: Diagnosis not present

## 2019-10-03 DIAGNOSIS — S0012XA Contusion of left eyelid and periocular area, initial encounter: Secondary | ICD-10-CM

## 2019-10-03 MED ORDER — IBUPROFEN 800 MG PO TABS: 800.0000 mg | ORAL_TABLET | Freq: Three times a day (TID) | ORAL | 0 refills | Status: DC

## 2019-10-03 NOTE — Discharge Instructions (Signed)
Apply ice frequently.  Never apply ice directly to skin always use a cloth to protect skin Continue to alternate ibuprofen and tylenol Ibuprofen 800 mg prescribed.  Use as directed If symptoms do not improve throughout the course of the day you should go to the ED for possible CT scan to rule out fracture If symptoms worsen go to the ED immediately such as blurred vision, eye redness, worsening pain, deficits with extraocular eye movements, fever, chills, nausea, vomiting, etc..Marland Kitchen

## 2019-10-03 NOTE — ED Provider Notes (Addendum)
[name]  Virtual Visit via Video Note:  Kathleen Luna  initiated request for Telemedicine visit with [facility] team. I connected with Kathleen Luna  on 10/03/2019 at 9:53 AM  for a synchronized telemedicine visit using a video enabled HIPPA compliant telemedicine application. I verified that I am speaking with Kathleen Luna  using two identifiers. [name], PA-C  was physically located in a [facility]gent care site and [name] was located at a different location.   The limitations of evaluation and management by telemedicine as well as the availability of in-person appointments were discussed. Patient was informed that she  may incur a bill ( including co-pay) for this virtual visit encounter. Kathleen Luna  expressed understanding and gave verbal consent to proceed with virtual visit.  [id number] 10/03/19 Arrival Time: 0929  CC: LT eyelid swelling  SUBJECTIVE:  Kathleen Luna is a 54 y.o. female who presents with complaint of LT eyelid swelling and bruising that occurred last night.  Symptoms began after bumping her eye against her friends head.  States she saw "stars" initially and then vision returned. Has tried icing and OTC medications without relief.  Symptoms are made worse with to the touch.  Denies similar symptoms in the past.  Complains of possible blurred vision, but may be secondary to swelling.  Denies fever, chills, nausea, vomiting, painful eye movements, halos, discharge, itching, vision changes, double vision.  ROS: As per HPI.  All other pertinent ROS negative.     Past Medical History:  Diagnosis Date  . Headache(784.0)    otc med prn  . Hyperlipidemia   . Hypothyroidism   . Hypothyroidism, adult 06/02/2019  . Primary ovarian failure 06/02/2019  . SVD (spontaneous vaginal delivery)    x 1  . Vitamin D deficiency disease 06/02/2019   Past Surgical History:  Procedure Laterality Date  . BREAST SURGERY  09/2010   augmentation  . VULVAR LESION REMOVAL  01/17/2012   Procedure: VULVAR LESION;  Surgeon: Allena Katz, MD;  Location: [facility];  Service: Gynecology;  Laterality: N/A;  incision and drainage  of perineal abscess  . WISDOM TOOTH EXTRACTION     Allergies  Allergen Reactions  . Hydrocodone Itching   No current facility-administered medications on file prior to encounter.   Current Outpatient Medications on File Prior to Encounter  Medication Sig Dispense Refill  . atorvastatin (LIPITOR) 40 MG tablet TAKE 1 TABLET BY MOUTH ONCE DAILY 30 tablet 3  . Cholecalciferol (VITAMIN D-3) 125 MCG (5000 UT) TABS Take 2 tablets by mouth daily.    Marland Kitchen estradiol (ESTRACE) 1 MG tablet TAKE 1 TABLET BY MOUTH DAILY 30 tablet 3  . NP THYROID 90 MG tablet TAKE 1 TABLET BY MOUTH ONCE DAILY 30 tablet 3  . progesterone (PROMETRIUM) 200 MG capsule TAKE 1 CAPSULE BY MOUTH EVERY NIGHT 30 capsule 3  . TESTOSTERONE CYPIONATE IM Inject 5 mg into the muscle once a week. COMPOUNDED    . [DISCONTINUED] estradiol (ESTRACE) 1 MG tablet Take 1 mg by mouth daily.      OBJECTIVE:  There were no vitals filed for this visit.  General appearance: alert; no distress Eyes: LT eye with swelling and ecchymosis, PT reports NTTP over orbital rim, PT looks around room without obvious EOM deficits or discomfort; no conjunctival erythema; no obvious drainage (see picture below) Lungs: Normal respiratory effort; speaking in full sentences without difficulty Neuro: No slurred speech; facial  asymmetries Psychological: alert and cooperative; normal mood and affect    ASSESSMENT & PLAN:  1. Black eye of left side, initial encounter   2. Eye contusion, left, initial encounter     Meds ordered this encounter  Medications  . ibuprofen (ADVIL) 800 MG tablet    Sig: Take 1 tablet (800 mg total) by mouth 3 (three) times daily.    Dispense:  30 tablet    Refill:  0    Order Specific Question:   Supervising Provider    Answer:    Raylene Everts [id number]   Apply ice frequently.  Never apply ice directly to skin always use a cloth to protect skin Continue to alternate ibuprofen and tylenol Ibuprofen 800 mg prescribed.  Use as directed If symptoms do not improve throughout the course of the day you should go to the ED for possible CT scan to rule out fracture If symptoms worsen go to the ED immediately such as blurred vision, eye redness, worsening pain, deficits with extraocular eye movements, fever, chills, nausea, vomiting, etc...  I discussed the assessment and treatment plan with the patient. The patient was provided an opportunity to ask questions and all were answered. The patient agreed with the plan and demonstrated an understanding of the instructions.   The patient was advised to call back or seek an in-person evaluation if the symptoms worsen or if the condition fails to improve as anticipated.  I provided 8 minutes of non-face-to-face time during this encounter.  [name], PA-C  10/03/2019 9:53 AM       [name], PA-C 10/03/19 [phone]

## 2019-10-05 ENCOUNTER — Encounter (INDEPENDENT_AMBULATORY_CARE_PROVIDER_SITE_OTHER): Payer: Self-pay | Admitting: Internal Medicine

## 2019-10-05 ENCOUNTER — Other Ambulatory Visit (HOSPITAL_BASED_OUTPATIENT_CLINIC_OR_DEPARTMENT_OTHER): Payer: Self-pay | Admitting: Optometry

## 2019-10-05 DIAGNOSIS — H02844 Edema of left upper eyelid: Secondary | ICD-10-CM

## 2019-10-05 DIAGNOSIS — H2000 Unspecified acute and subacute iridocyclitis: Secondary | ICD-10-CM | POA: Diagnosis not present

## 2019-10-05 MED FILL — PREDNISOLONE AC 1% EYE DROP: 1 | 14 days supply | Qty: 5 | Fill #0

## 2019-10-05 MED FILL — ATORVASTATIN 40 MG TABLET: 40 | 30 days supply | Qty: 30 | Fill #2

## 2019-10-05 MED FILL — NP THYROID 90 MG TABLET: 90 | 30 days supply | Qty: 30 | Fill #2

## 2019-10-06 ENCOUNTER — Other Ambulatory Visit: Payer: Self-pay

## 2019-10-06 ENCOUNTER — Ambulatory Visit (HOSPITAL_BASED_OUTPATIENT_CLINIC_OR_DEPARTMENT_OTHER)
Admission: RE | Admit: 2019-10-06 | Discharge: 2019-10-06 | Disposition: A | Discharge status: Home / Self Care | Payer: 59 | Source: Ambulatory Visit | Attending: Optometry | Admitting: Optometry

## 2019-10-06 DIAGNOSIS — H02844 Edema of left upper eyelid: Secondary | ICD-10-CM | POA: Insufficient documentation

## 2019-10-06 DIAGNOSIS — S0012XA Contusion of left eyelid and periocular area, initial encounter: Secondary | ICD-10-CM | POA: Diagnosis not present

## 2019-10-07 MED FILL — CLINDAMYCIN HCL 300 MG CAPS: 300 | 7 days supply | Qty: 21 | Fill #0

## 2019-10-13 MED FILL — ESTRADIOL 1 MG TABS: 1 | 30 days supply | Qty: 30 | Fill #3

## 2019-10-14 DIAGNOSIS — H2000 Unspecified acute and subacute iridocyclitis: Secondary | ICD-10-CM | POA: Diagnosis not present

## 2019-10-19 ENCOUNTER — Encounter (INDEPENDENT_AMBULATORY_CARE_PROVIDER_SITE_OTHER): Payer: 59 | Admitting: Internal Medicine

## 2019-10-21 MED FILL — PROGESTERONE 200 MG CAPSULE: 200 | 30 days supply | Qty: 30 | Fill #2

## 2019-11-03 MED FILL — NP THYROID 90 MG TABLET: 90 | 30 days supply | Qty: 30 | Fill #3

## 2019-11-04 MED FILL — ATORVASTATIN 40 MG TABLET: 40 | 30 days supply | Qty: 30 | Fill #3

## 2019-11-09 ENCOUNTER — Ambulatory Visit (INDEPENDENT_AMBULATORY_CARE_PROVIDER_SITE_OTHER): Payer: 59 | Admitting: Internal Medicine

## 2019-11-09 ENCOUNTER — Other Ambulatory Visit: Payer: Self-pay

## 2019-11-09 ENCOUNTER — Encounter (INDEPENDENT_AMBULATORY_CARE_PROVIDER_SITE_OTHER): Payer: Self-pay | Admitting: Internal Medicine

## 2019-11-09 VITALS — BP 120/78 | HR 87 | Temp 98.6°F | Ht 64.0 in | Wt 140.0 lb

## 2019-11-09 DIAGNOSIS — E559 Vitamin D deficiency, unspecified: Secondary | ICD-10-CM | POA: Diagnosis not present

## 2019-11-09 DIAGNOSIS — E785 Hyperlipidemia, unspecified: Secondary | ICD-10-CM

## 2019-11-09 DIAGNOSIS — E2839 Other primary ovarian failure: Secondary | ICD-10-CM | POA: Diagnosis not present

## 2019-11-09 DIAGNOSIS — I10 Essential (primary) hypertension: Secondary | ICD-10-CM

## 2019-11-09 DIAGNOSIS — Z0001 Encounter for general adult medical examination with abnormal findings: Secondary | ICD-10-CM

## 2019-11-09 DIAGNOSIS — Z131 Encounter for screening for diabetes mellitus: Secondary | ICD-10-CM

## 2019-11-09 DIAGNOSIS — E039 Hypothyroidism, unspecified: Secondary | ICD-10-CM

## 2019-11-09 NOTE — Progress Notes (Signed)
Chief Complaint: This 54 year old lady comes in for an annual physical exam and to address her chronic conditions which are listed below. HPI: She has a history of hyperlipidemia and takes statin therapy.  However, she does not have a history of coronary artery disease, cerebrovascular disease or diabetes. She has a history of hypothyroidism and takes desiccated NP thyroid for this.  She is tolerating the dose well. She has menopausal symptoms and takes bioidentical hormone therapy including estradiol, progesterone and testosterone and is tolerating all of these very well.  She denies any side effects. She has a history of vitamin D deficiency and continues to take vitamin D3 supplementation.  Past Medical History:  Diagnosis Date  . Headache(784.0)    otc med prn  . Hyperlipidemia   . Hypothyroidism   . Hypothyroidism, adult 06/02/2019  . Primary ovarian failure 06/02/2019  . SVD (spontaneous vaginal delivery)    x 1  . Vitamin D deficiency disease 06/02/2019   Past Surgical History:  Procedure Laterality Date  . BREAST SURGERY  09/2010   augmentation  . VULVAR LESION REMOVAL  01/17/2012   Procedure: VULVAR LESION;  Surgeon: Allena Katz, MD;  Location: [facility];  Service: Gynecology;  Laterality: N/A;  incision and drainage  of perineal abscess  . 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    Social History   Tobacco Use  . Smoking status: Current Every Day Smoker    Packs/day: 1.00    Years: 15.00    Pack years: 15.00    Types: Cigarettes    Last attempt to quit: 07/07/1997    Years since quitting: 22.3  . Smokeless tobacco: Never Used  . Tobacco comment: RESTART SMOKING  Substance Use Topics  . Alcohol use: Yes    Comment: weekends      Allergies:  Allergies  Allergen Reactions  . Hydrocodone Itching     Current  Meds  Medication Sig  . Ascorbic Acid (VITAMIN C) 1000 MG tablet Take 1,000 mg by mouth daily.  Marland Kitchen atorvastatin (LIPITOR) 40 MG tablet TAKE 1 TABLET BY MOUTH ONCE DAILY  . Cholecalciferol (VITAMIN D-3) 125 MCG (5000 UT) TABS Take 2 tablets by mouth daily.  Marland Kitchen estradiol (ESTRACE) 1 MG tablet TAKE 1 TABLET BY MOUTH DAILY  . ibuprofen (ADVIL) 800 MG tablet Take 1 tablet (800 mg total) by mouth 3 (three) times daily.  . NP THYROID 90 MG tablet TAKE 1 TABLET BY MOUTH ONCE DAILY  . progesterone (PROMETRIUM) 200 MG capsule TAKE 1 CAPSULE BY MOUTH EVERY NIGHT  . TESTOSTERONE CYPIONATE IM Inject 5 mg into the muscle once a week. COMPOUNDED     Nutrition She has done well with nutrition and does intermittent fasting on a daily basis and tries to eat healthy, usually 1 main meal a day.  Sleep Her sleep is adequate.  Exercise She walks every day whilst at work.  She has not done much in the way of strength training although she has purchased resistance bands and she plans to do resistance training soon.  Bio-identical hormones Testosterone therapy is being used off label for symptoms of testosterone deficiency and benefits that it produces based on several studies.  These benefits include decreasing body fat, increasing in lean muscle mass and increasing in bone density.  There is improvement of memory, cognition.  There is  improvement in exercise tolerance and endurance.  Testosterone therapy has also been shown to be protective against coronary artery disease, cerebrovascular disease, diabetes, hypertension and degenerative joint disease. I have discussed with the patient the FDA warnings regarding testosterone therapy, benefits and side effects and modes of administration as well as monitoring blood levels and side effects  on a regular basis The patient is agreeable that testosterone therapy should be an integral part of his/her wellness,quality of life and prevention of chronic disease.  Micronized  progesterone is being used in this patient for multiple benefits based on studies including protection against uterine cancer, breast cancer, osteoporosis and heart disease. The patient has been counseled regarding side effects, benefits and modes of administration. The patient is agreeable that this therapy is an integral part of her wellness, quality of life and prevention of chronic disease.  Estradiol is being used in this patient for multiple benefits based on several studies including protection against heart disease, cerebrovascular disease, osteoporosis, colon cancer, Alzheimer's disease, macular degeneration and cataracts. The patient has been counseled regarding benefits and side effects and modes of administration. The patient is agreeable that this therapy is an integral to part of her wellness, quality of life and prevention of chronic disease.  [id number] from the symptoms mentioned above,there are no other symptoms referable to all systems reviewed.  Physical Exam: Blood pressure 120/78, pulse 87, temperature 98.6 F (37 C), temperature source Temporal, height 5\' 4"  (1.626 m), weight 140 lb (63.5 kg), SpO2 99 %. Vitals with BMI 11/09/2019 06/02/2019 11/04/2018  Height 5\' 4"  5\' 4"  -  Weight 140 lbs 156 lbs 6 oz -  BMI [id number] -  Systolic 123456 [id number]  Diastolic 78 80 70  Pulse 87 72 74      She looks systemically well.  She has lost 16 pounds since the last time I saw her in October and is at a good weight now.  Her blood pressure has also improved. General: Alert, cooperative, and appears to be the stated age.No pallor.  No jaundice.  No clubbing. Head: Normocephalic Eyes: Sclera white, pupils equal and reactive to light, red reflex x 2,  Ears: Normal bilaterally Oral cavity: Lips, mucosa, and tongue normal: Teeth and gums normal Neck: No adenopathy, supple, symmetrical, trachea midline, and thyroid does not appear enlarged. Breast: No masses felt. Respiratory: Clear to  auscultation bilaterally.No wheezing, crackles or bronchial breathing. Cardiovascular: Heart sounds are present and appear to be normal without murmurs or added sounds.  No carotid bruits.  Peripheral pulses are present and equal bilaterally.: Gastrointestinal:positive bowel sounds, no hepatosplenomegaly.  No masses felt.No tenderness. Skin: Clear, No rashes noted.No worrisome skin lesions seen.  Multiple tattoos. Neurological: Grossly intact without focal findings, cranial nerves II through XII intact, muscle strength equal bilaterally Musculoskeletal: No acute joint abnormalities noted.Full range of movement noted with joints. Psychiatric: Affect appropriate, non-anxious.    Assessment  1. Hypothyroidism, adult   2. Dyslipidemia   3. Essential hypertension   4. Vitamin D deficiency disease   5. Primary ovarian failure   6. Encounter for general adult medical examination with abnormal findings   7. Screening for diabetes mellitus     Tests Ordered:   Orders Placed This Encounter  Procedures  . CBC  . COMPLETE METABOLIC PANEL WITH GFR  . Estradiol  . Hemoglobin A1c  . Lipid panel  . Progesterone  . T3, free  . Testos,Total,Free and SHBG (Female)  . VITAMIN D 25 Hydroxy (Vit-D Deficiency,  Fractures)     Plan  1. Blood work is ordered as above. 2. She will continue with desiccated NP thyroid and we will see what her T3 levels are to see if we need to optimize further. 3. She did have a history of hypertension but her blood pressure is normal without medications.  Continue to monitor her. 4. She will continue with bioidentical hormone therapy and we will check levels today. 5. She will continue with vitamin D3 supplementation and we will see what the levels are. 6. We discussed nutrition again with reinforcement of intermittent fasting but also combined with a plant-based diet.  I encouraged her to engage in strength training that she is planning to do. 7. Further  recommendations will depend on blood results and I will see her in 3 months time for follow-up. 8. Today, in addition to a preventative visit, I performed an office visit independently to address her chronic conditions above     No orders of the defined types were placed in this encounter.    Kathleen Luna C Nailea Whitehorn   11/09/2019, 3:43 PM

## 2019-11-09 NOTE — Patient Instructions (Signed)
[facility] Dietary Recommendations for Weight Loss What to Avoid . Avoid added sugars o Often added sugar can be found in processed foods such as many condiments, dry cereals, cakes, cookies, chips, crisps, crackers, candies, sweetened drinks, etc.  o Read labels and AVOID/DECREASE use of foods with the following in their ingredient list: Sugar, fructose, high fructose corn syrup, sucrose, glucose, maltose, dextrose, molasses, cane sugar, brown sugar, any type of syrup, agave nectar, etc.   . Avoid snacking in between meals . Avoid foods made with flour o If you are going to eat food made with flour, choose those made with whole-grains; and, minimize your consumption as much as is tolerable . Avoid processed foods o These foods are generally stocked in the middle of the grocery store. Focus on shopping on the perimeter of the grocery.  . Avoid Meat  o We recommend following a plant-based diet at [facility]. Thus, we recommend avoiding meat as a general rule. Consider eating beans, legumes, eggs, and/or dairy products for regular protein sources o If you plan on eating meat limit to 4 ounces of meat at a time and choose lean options such as Fish, chicken, turkey. Avoid red meat intake such as pork and/or steak What to Include . Vegetables o GREEN LEAFY VEGETABLES: Kale, spinach, mustard greens, collard greens, cabbage, broccoli, etc. o OTHER: Asparagus, cauliflower, eggplant, carrots, peas, Brussel sprouts, tomatoes, bell peppers, zucchini, beets, cucumbers, etc. . Grains, seeds, and legumes o Beans: kidney beans, black eyed peas, garbanzo beans, black beans, pinto beans, etc. o Whole, unrefined grains: brown rice, barley, bulgur, oatmeal, etc. . Healthy fats  o Avoid highly processed fats such as vegetable oil o Examples of healthy fats: avocado, olives, virgin olive oil, dark chocolate (?72% Cocoa), nuts (peanuts, almonds, walnuts, cashews, pecans, etc.) . None to Low  Intake of Animal Sources of Protein o Meat sources: chicken, turkey, salmon, tuna. Limit to 4 ounces of meat at one time. o Consider limiting dairy sources, but when choosing dairy focus on: PLAIN Greek yogurt, cottage cheese, high-protein milk . Fruit o Choose berries  When to Eat . Intermittent Fasting: o Choosing not to eat for a specific time period, but DO FOCUS ON HYDRATION when fasting o Multiple Techniques: - Time Restricted Eating: eat 3 meals in a day, each meal lasting no more than 60 minutes, no snacks between meals - 16-18 hour fast: fast for 16 to 18 hours up to 7 days a week. Often suggested to start with 2-3 nonconsecutive days per week.  . Remember the time you sleep is counted as fasting.  . Examples of eating schedule: Fast from 7:00pm-11:00am. Eat between 11:00am-7:00pm.  - 24-hour fast: fast for 24 hours up to every other day. Often suggested to start with 1 day per week . Remember the time you sleep is counted as fasting . Examples of eating schedule:  o Eating day: eat 2-3 meals on your eating day. If doing 2 meals, each meal should last no more than 90 minutes. If doing 3 meals, each meal should last no more than 60 minutes. Finish last meal by 7:00pm. o Fasting day: Fast until 7:00pm.  o IF YOU FEEL UNWELL FOR ANY REASON/IN ANY WAY WHEN FASTING, STOP FASTING BY EATING A NUTRITIOUS SNACK OR LIGHT MEAL o ALWAYS FOCUS ON HYDRATION DURING FASTS - Acceptable Hydration sources: water, broths, tea/coffee (black tea/coffee is best but using a small amount of whole-fat dairy products in coffee/tea is acceptable).  -   Poor Hydration Sources: anything with sugar or artificial sweeteners added to it  These recommendations have been developed for patients that are actively receiving medical care from either Dr. Tatumn Corbridge or Sarah Gray, DNP, NP-C at [facility]. These recommendations are developed for patients with specific medical conditions and are not meant to be  distributed or used by others that are not actively receiving care from either provider listed above at [facility]. It is not appropriate to participate in the above eating plans without proper medical supervision.   Reference: Fung, J. The obesity code. [place]: [place]; 2016.   

## 2019-11-10 MED FILL — AMOXICILLIN 875 MG TABS: 875 | 5 days supply | Qty: 11 | Fill #0

## 2019-11-10 MED FILL — FLUCONAZOLE 150 MG TABS: 150 | 2 days supply | Qty: 2 | Fill #0

## 2019-11-11 ENCOUNTER — Other Ambulatory Visit (INDEPENDENT_AMBULATORY_CARE_PROVIDER_SITE_OTHER): Payer: Self-pay | Admitting: Internal Medicine

## 2019-11-11 ENCOUNTER — Encounter (INDEPENDENT_AMBULATORY_CARE_PROVIDER_SITE_OTHER): Payer: Self-pay | Admitting: Internal Medicine

## 2019-11-11 MED ORDER — VARENICLINE TARTRATE 0.5 MG PO TABS: 0.5000 mg | ORAL_TABLET | Freq: Two times a day (BID) | ORAL | 3 refills | Status: DC

## 2019-11-11 MED FILL — CHANTIX 0.5 MG TABLET: 0.5 | 28 days supply | Qty: 56 | Fill #0

## 2019-11-13 LAB — COMPLETE METABOLIC PANEL WITH GFR
AG Ratio: 1.4 (calc) (ref 1.0–2.5)
ALT: 16 U/L (ref 6–29)
AST: 19 U/L (ref 10–35)
Albumin: 4.2 g/dL (ref 3.6–5.1)
Alkaline phosphatase (APISO): 87 U/L (ref 37–153)
BUN: 11 mg/dL (ref 7–25)
CO2: 28 mmol/L (ref 20–32)
Calcium: 10.1 mg/dL (ref 8.6–10.4)
Chloride: 103 mmol/L (ref 98–110)
Creat: 0.61 mg/dL (ref 0.50–1.05)
GFR, Est African American: 118 mL/min/{1.73_m2} (ref >=60)
GFR, Est Non African American: 102 mL/min/{1.73_m2} (ref >=60)
Globulin: 3.1 g/dL (calc) (ref 1.9–3.7)
Glucose, Bld: 85 mg/dL (ref 65–99)
Potassium: 4.4 mmol/L (ref 3.5–5.3)
Sodium: 140 mmol/L (ref 135–146)
Total Bilirubin: 0.5 mg/dL (ref 0.2–1.2)
Total Protein: 7.3 g/dL (ref 6.1–8.1)

## 2019-11-13 LAB — CBC
HCT: 38.2 % (ref 35.0–45.0)
Hemoglobin: 12.6 g/dL (ref 11.7–15.5)
MCH: 29.7 pg (ref 27.0–33.0)
MCHC: 33 g/dL (ref 32.0–36.0)
MCV: 90.1 fL (ref 80.0–100.0)
MPV: 10.3 fL (ref 7.5–12.5)
Platelets: 329 10*3/uL (ref 140–400)
RBC: 4.24 10*6/uL (ref 3.80–5.10)
RDW: 13.4 % (ref 11.0–15.0)
WBC: 11.2 10*3/uL — ABNORMAL HIGH (ref 3.8–10.8)

## 2019-11-13 LAB — TESTOS,TOTAL,FREE AND SHBG (FEMALE)
Free Testosterone: 1.4 pg/mL (ref 0.1–6.4)
Sex Hormone Binding: 107 nmol/L (ref 17–124)
Testosterone, Total, LC-MS-MS: 17 ng/dL (ref 2–45)

## 2019-11-13 LAB — LIPID PANEL
Cholesterol: 191 mg/dL (ref <200)
HDL: 43 mg/dL — ABNORMAL LOW (ref >=50)
LDL Cholesterol (Calc): 129 mg/dL (calc) — ABNORMAL HIGH
Non-HDL Cholesterol (Calc): 148 mg/dL (calc) — ABNORMAL HIGH (ref <130)
Total CHOL/HDL Ratio: 4.4 (calc) (ref <5.0)
Triglycerides: 91 mg/dL (ref <150)

## 2019-11-13 LAB — PROGESTERONE: Progesterone: 7.1 ng/mL

## 2019-11-13 LAB — ESTRADIOL: Estradiol: 27 pg/mL

## 2019-11-13 LAB — VITAMIN D 25 HYDROXY (VIT D DEFICIENCY, FRACTURES): Vit D, 25-Hydroxy: 70 ng/mL (ref 30–100)

## 2019-11-13 LAB — HEMOGLOBIN A1C
Hgb A1c MFr Bld: 5.1 % of total Hgb (ref <5.7)
Mean Plasma Glucose: 100 (calc)
eAG (mmol/L): 5.5 (calc)

## 2019-11-13 LAB — T3, FREE: T3, Free: 4.9 pg/mL — ABNORMAL HIGH (ref 2.3–4.2)

## 2019-11-16 ENCOUNTER — Encounter (INDEPENDENT_AMBULATORY_CARE_PROVIDER_SITE_OTHER): Payer: Self-pay | Admitting: Internal Medicine

## 2019-11-17 ENCOUNTER — Encounter (INDEPENDENT_AMBULATORY_CARE_PROVIDER_SITE_OTHER): Payer: Self-pay | Admitting: Internal Medicine

## 2019-11-17 ENCOUNTER — Other Ambulatory Visit (INDEPENDENT_AMBULATORY_CARE_PROVIDER_SITE_OTHER): Payer: Self-pay | Admitting: Internal Medicine

## 2019-11-17 MED ORDER — ESTRADIOL 1 MG PO TABS: 1.5000 mg | ORAL_TABLET | Freq: Every day | ORAL | 3 refills | Status: DC

## 2019-11-17 MED ORDER — PROGESTERONE MICRONIZED 200 MG PO CAPS: 400.0000 mg | ORAL_CAPSULE | Freq: Every day | ORAL | 3 refills | Status: DC

## 2019-11-17 MED FILL — PROGESTERONE 200 MG CAPSULE: 200 | 30 days supply | Qty: 60 | Fill #0

## 2019-11-17 MED FILL — ESTRADIOL 1 MG TABS: 1 | 30 days supply | Qty: 45 | Fill #0

## 2019-11-17 NOTE — Telephone Encounter (Signed)
[facility]  Phone Number: [phone]  Dr Anastasio Champion can you send in new Rx's for me since the dosage's will change .   Estridiol, Progesterone can be called /faxed to [place] [facility]. Can they fill Testoserone there as well. If not [place] would be great. [place]  I also started by [facility] on Saturday. Ivor Costa is my quit date. just wanted to give you an update   Thanks again for EVERYTHING!!   Let me know if this doesn't make sense.   Kenney Houseman

## 2019-12-03 ENCOUNTER — Ambulatory Visit (INDEPENDENT_AMBULATORY_CARE_PROVIDER_SITE_OTHER): Payer: 59 | Admitting: Family Medicine

## 2019-12-03 ENCOUNTER — Ambulatory Visit (HOSPITAL_BASED_OUTPATIENT_CLINIC_OR_DEPARTMENT_OTHER)
Admission: RE | Admit: 2019-12-03 | Discharge: 2019-12-03 | Disposition: A | Discharge status: Home / Self Care | Payer: 59 | Source: Ambulatory Visit | Attending: Family Medicine | Admitting: Family Medicine

## 2019-12-03 ENCOUNTER — Encounter: Payer: Self-pay | Admitting: Family Medicine

## 2019-12-03 ENCOUNTER — Other Ambulatory Visit: Payer: Self-pay

## 2019-12-03 ENCOUNTER — Ambulatory Visit: Payer: Self-pay

## 2019-12-03 VITALS — BP 142/86 | HR 80 | Ht 64.0 in | Wt 140.0 lb

## 2019-12-03 DIAGNOSIS — S73191A Other sprain of right hip, initial encounter: Secondary | ICD-10-CM | POA: Insufficient documentation

## 2019-12-03 DIAGNOSIS — M25551 Pain in right hip: Secondary | ICD-10-CM | POA: Diagnosis not present

## 2019-12-03 HISTORY — DX: Other sprain of right hip, initial encounter: S73.191A

## 2019-12-03 MED ORDER — TRIAMCINOLONE ACETONIDE 40 MG/ML IJ SUSP
40.0000 mg | Freq: Once | INTRAMUSCULAR | Status: AC
  Administered 2019-12-03: 40 mg via INTRA_ARTICULAR

## 2019-12-03 NOTE — Patient Instructions (Signed)
Nice to meet you Please try ice  Please try the exercises  I will call with the xray results from today   Please send me a message in MyChart with any questions or updates.  Please see me back in 4 weeks.   --Dr. Raeford Razor

## 2019-12-03 NOTE — Progress Notes (Signed)
Kathleen Luna - 54 y.o. female MRN [id number]  Date of birth: Aug 06, 1966  SUBJECTIVE:  Including CC & ROS.  Chief Complaint  Patient presents with  . Hip Pain    right hip    Kathleen Luna is a 54 y.o. female that is presenting with acute on chronic right hip pain.  The pain has been ongoing for over a year.  The pain is causing her to limp and she is able to go up and down stairs without significant pain.  She has tried heat and ice and anti-inflammatories with no improvement.  No history of surgery.  She reports a distant history of dislocation in the 72s.  Has received intra-articular injection about 8 years ago.  Seems of gotten worse as of late..  Independent review of the MRI lumbar spine from 08/19/2018 shows facet arthropathy at L4-5. Independent review of the CT abdomen and pelvis from 03/27/2012 shows no significant hip joint disease or SI joint disease.  Review of Systems See HPI   HISTORY: Past Medical, Surgical, Social, and Family History Reviewed & Updated per EMR.   Pertinent Historical Findings include:  Past Medical History:  Diagnosis Date  . Headache(784.0)    otc med prn  . Hyperlipidemia   . Hypothyroidism   . Hypothyroidism, adult 06/02/2019  . Primary ovarian failure 06/02/2019  . SVD (spontaneous vaginal delivery)    x 1  . Vitamin D deficiency disease 06/02/2019    Past Surgical History:  Procedure Laterality Date  . BREAST SURGERY  09/2010   augmentation  . VULVAR LESION REMOVAL  01/17/2012   Procedure: VULVAR LESION;  Surgeon: Allena Katz, MD;  Location: [facility];  Service: Gynecology;  Laterality: N/A;  incision and drainage  of perineal abscess  . WISDOM TOOTH EXTRACTION      Family History  Problem Relation Age of Onset  . Hyperlipidemia Mother   . Hypertension Mother   . Dementia Mother   . Early death Father   . Heart attack Father   . Cancer Sister   . Heart disease Sister   . Cancer Brother   . Hyperlipidemia Daughter      Social History   Socioeconomic History  . Marital status: Single    Spouse name: Not on file  . Number of children: Not on file  . Years of education: Not on file  . Highest education level: Not on file  Occupational History  . Not on file  Tobacco Use  . Smoking status: Current Every Day Smoker    Packs/day: 1.00    Years: 15.00    Pack years: 15.00    Types: Cigarettes    Last attempt to quit: 07/07/1997    Years since quitting: 22.4  . Smokeless tobacco: Never Used  . Tobacco comment: RESTART SMOKING  Substance and Sexual Activity  . Alcohol use: Yes    Comment: weekends  . Drug use: No  . Sexual activity: Yes    Birth control/protection: I.U.D.  Other Topics Concern  . 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   Social Determinants of Health   Financial Resource Strain:   . Difficulty of Paying Living Expenses:   Food Insecurity:   . Worried About Charity fundraiser in the Last Year:   . [place] in the Last Year:   Transportation Needs:   . Lack of  Transportation (Medical):   Marland Kitchen Lack of Transportation (Non-Medical):   Physical Activity:   . Days of Exercise per Week:   . Minutes of Exercise per Session:   Stress:   . Feeling of Stress :   Social Connections:   . Frequency of Communication with Friends and Family:   . Frequency of Social Gatherings with Friends and Family:   . Attends Religious Services:   . Active Member of Clubs or Organizations:   . Attends Archivist Meetings:   Marland Kitchen Marital Status:   Intimate Partner Violence:   . Fear of Current or Ex-Partner:   . Emotionally Abused:   Marland Kitchen Physically Abused:   . Sexually Abused:      PHYSICAL EXAM:  VS: BP (!) 142/86   Pulse 80   Ht 5\' 4"  (1.626 m)   Wt 140 lb (63.5 kg)   BMI 24.03 kg/m  Physical Exam Gen: NAD, alert, cooperative with exam, well-appearing MSK:   Right hip: Instability with hip flexion and abduction. Pain with internal rotation. Normal strength with hip flexion  No TTP of the greater troch  No tender to palpation over the SI joint. Positive FADIR and fABER  Neurovascularly intact   Aspiration/Injection Procedure Note Kathleen Luna [birth year]  Procedure: Injection Indications: Right hip pain  Procedure Details Consent: Risks of procedure as well as the alternatives and risks of each were explained to the (patient/caregiver).  Consent for procedure obtained. Time Out: Verified patient identification, verified procedure, site/side was marked, verified correct patient position, special equipment/implants available, medications/allergies/relevent history reviewed, required imaging and test results available.  Performed.  The area was cleaned with iodine and alcohol swabs.    The right hip joint was injected using 1 cc's of 40 mg Kenalog and 4 cc's of 0.25% bupivacaine with a 22 3 1/2" needle.  Ultrasound was used. Images were obtained in long views showing the injection.     A sterile dressing was applied.  Patient did tolerate procedure well.     ASSESSMENT & PLAN:   Tear of right acetabular labrum Acute on chronic in nature.  Likely labral portion and origin as she does have a history of a hip dislocation several years ago.  Has hip abduction weakness on ipsilateral side which is possible for greater troches bursitis as well. -Injection. -X-ray. -Counseled on home exercise therapy and supportive care. -If no improvement could consider physical therapy or MRI.

## 2019-12-03 NOTE — Assessment & Plan Note (Signed)
Acute on chronic in nature.  Likely labral portion and origin as she does have a history of a hip dislocation several years ago.  Has hip abduction weakness on ipsilateral side which is possible for greater troches bursitis as well. -Injection. -X-ray. -Counseled on home exercise therapy and supportive care. -If no improvement could consider physical therapy or MRI.

## 2019-12-04 ENCOUNTER — Telehealth: Payer: Self-pay | Admitting: Family Medicine

## 2019-12-04 NOTE — Telephone Encounter (Signed)
Spoke to patient and gave result information as provided by the physician. 

## 2019-12-04 NOTE — Telephone Encounter (Signed)
Patient returning call for xray results  

## 2019-12-04 NOTE — Telephone Encounter (Signed)
Left VM for patient. If she calls back please have her speak with a nurse/CMA and inform that her xray is normal. The xray won't show the labrum. If still having pain we may need to try an injection on the outside of the hip.   If any questions then please take the best time and phone number to call and I will try to call her back.   Rosemarie Ax, MD [facility] 12/04/2019, 8:14 AM

## 2019-12-08 ENCOUNTER — Other Ambulatory Visit (INDEPENDENT_AMBULATORY_CARE_PROVIDER_SITE_OTHER): Payer: Self-pay | Admitting: Internal Medicine

## 2019-12-08 MED FILL — ATORVASTATIN 40 MG TABLET: 40 | 30 days supply | Qty: 30 | Fill #0

## 2019-12-08 MED FILL — NP THYROID 90 MG TABLET: 90 | 30 days supply | Qty: 30 | Fill #0

## 2019-12-21 MED FILL — PROGESTERONE MICRONIZED 200: 200 | 30 days supply | Qty: 60 | Fill #1

## 2019-12-21 MED FILL — ESTRADIOL 1 MG TABS: 1 | 30 days supply | Qty: 45 | Fill #1

## 2019-12-28 DIAGNOSIS — H5203 Hypermetropia, bilateral: Secondary | ICD-10-CM | POA: Diagnosis not present

## 2019-12-28 DIAGNOSIS — H52203 Unspecified astigmatism, bilateral: Secondary | ICD-10-CM | POA: Diagnosis not present

## 2020-01-07 ENCOUNTER — Ambulatory Visit: Payer: 59 | Admitting: Family Medicine

## 2020-01-26 ENCOUNTER — Encounter (INDEPENDENT_AMBULATORY_CARE_PROVIDER_SITE_OTHER): Payer: Self-pay | Admitting: Internal Medicine

## 2020-01-26 ENCOUNTER — Ambulatory Visit (INDEPENDENT_AMBULATORY_CARE_PROVIDER_SITE_OTHER): Payer: 59 | Admitting: Internal Medicine

## 2020-01-26 ENCOUNTER — Other Ambulatory Visit: Payer: Self-pay

## 2020-01-26 VITALS — BP 130/90 | HR 87 | Temp 93.4°F | Ht 64.0 in | Wt 140.8 lb

## 2020-01-26 DIAGNOSIS — E039 Hypothyroidism, unspecified: Secondary | ICD-10-CM | POA: Diagnosis not present

## 2020-01-26 DIAGNOSIS — Z13820 Encounter for screening for osteoporosis: Secondary | ICD-10-CM | POA: Diagnosis not present

## 2020-01-26 DIAGNOSIS — I10 Essential (primary) hypertension: Secondary | ICD-10-CM

## 2020-01-26 DIAGNOSIS — M791 Myalgia, unspecified site: Secondary | ICD-10-CM

## 2020-01-26 DIAGNOSIS — E559 Vitamin D deficiency, unspecified: Secondary | ICD-10-CM | POA: Diagnosis not present

## 2020-01-26 DIAGNOSIS — E2839 Other primary ovarian failure: Secondary | ICD-10-CM

## 2020-01-26 DIAGNOSIS — E785 Hyperlipidemia, unspecified: Secondary | ICD-10-CM | POA: Diagnosis not present

## 2020-01-26 NOTE — Progress Notes (Signed)
Metrics: Intervention Frequency ACO  Documented Smoking Status Yearly  Screened one or more times in 24 months  Cessation Counseling or  Active cessation medication Past 24 months  Past 24 months   Guideline developer: UpToDate (See UpToDate for funding source) Date Released: 2014       Wellness Office Visit  Subjective:  Patient ID: Kathleen Luna, female    DOB: [birth year]  Age: 54 y.o. MRN: [id number]  CC: This lady comes in for follow-up of bioidentical hormone therapy, hypothyroidism and hyperlipidemia. HPI  She has been noted to have a low HDL cholesterol level.  She is on statin therapy.  She has never had history of coronary artery disease, cerebrovascular disease or diabetes.  She complains of muscle pain, mostly in the right thigh. She has seen a sports medicine doctor for hip pain and had injections. She continues on desiccated NP thyroid for hypothyroidism and her T3 levels were in a good range last time.  She is able to tolerate this dose very easily. Also on the last visit, we increased her estradiol, progesterone and testosterone doses to further optimize.  All of these, she has tolerated and she feels that she has more energy than before and without any side effects. She continues to do intermittent fasting on a daily basis and is going towards a plant-based diet.  She admits that she is not as consistent as she was like on exercise. Unfortunately, she still continues to smoke cigarettes.  She is thinking about hypnosis to help her quit. Past Medical History:  Diagnosis Date   Headache(784.0)    otc med prn   Hyperlipidemia    Hypothyroidism    Hypothyroidism, adult 06/02/2019   Primary ovarian failure 06/02/2019   SVD (spontaneous vaginal delivery)    x 1   Vitamin D deficiency disease 06/02/2019   Past Surgical History:  Procedure Laterality Date   BREAST SURGERY  09/2010   augmentation   VULVAR LESION REMOVAL  01/17/2012   Procedure: VULVAR LESION;   Surgeon: Allena Katz, MD;  Location: [facility];  Service: Gynecology;  Laterality: N/A;  incision and drainage  of perineal abscess   WISDOM TOOTH EXTRACTION       Family History  Problem Relation Age of Onset   Hyperlipidemia Mother    Hypertension Mother    Dementia Mother    Early death Father    Heart attack Father    Cancer Sister    Heart disease Sister    Cancer Brother    Hyperlipidemia Daughter     Social History   Social History Narrative   Divorced for almost 2 year,married for 3 year,Second marriage.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   Social History   Tobacco Use   Smoking status: Current Every Day Smoker    Packs/day: 1.00    Years: 15.00    Pack years: 15.00    Types: Cigarettes    Last attempt to quit: 07/07/1997    Years since quitting: 22.5   Smokeless tobacco: Never Used   Tobacco comment: RESTART SMOKING  Substance Use Topics   Alcohol use: Yes    Comment: weekends    Current Meds  Medication Sig   Ascorbic Acid (VITAMIN C) 1000 MG tablet Take 1,000 mg by mouth daily.   atorvastatin (LIPITOR) 40 MG tablet TAKE 1 TABLET BY MOUTH ONCE DAILY   Cholecalciferol (VITAMIN D-3) 125 MCG (5000 UT) TABS Take 2 tablets by mouth daily.   estradiol (ESTRACE) 1 MG  tablet Take 1.5 tablets (1.5 mg total) by mouth daily.   ibuprofen (ADVIL) 800 MG tablet Take 1 tablet (800 mg total) by mouth 3 (three) times daily.   NP THYROID 90 MG tablet TAKE 1 TABLET BY MOUTH ONCE DAILY   progesterone (PROMETRIUM) 200 MG capsule Take 2 capsules (400 mg total) by mouth daily.   TESTOSTERONE CYPIONATE IM Inject 5 mg into the muscle 2 (two) times a week. COMPOUNDED        Depression screen [facility] 2/9 01/26/2020 11/09/2019  Decreased Interest 0 0  Down, Depressed, Hopeless 0 0  PHQ - 2 Score 0 0     Objective:   Today's Vitals: BP 130/90 (BP Location: Left Arm, Patient Position: Sitting, Cuff Size: Normal)    Pulse 87    Temp (!) 93.4 F  (34.1 C) (Temporal)    Ht 5\' 4"  (1.626 m)    Wt 140 lb 12.8 oz (63.9 kg)    SpO2 98%    BMI 24.17 kg/m  Vitals with BMI 01/26/2020 12/03/2019 11/09/2019  Height 5\' 4"  5\' 4"  5\' 4"   Weight 140 lbs 13 oz 140 lbs 140 lbs  BMI 24.16 [id number]  Systolic [id number]  Diastolic 90 86 78  Pulse 87 80 87     Physical Exam  She looks systemically well.  Her weight is very stable.  Blood pressure is reasonable.  She is alert and orientated without any obvious focal neurological signs.     Assessment   1. Hypothyroidism, adult   2. Vitamin D deficiency disease   3. Primary ovarian failure   4. Essential hypertension   5. Dyslipidemia   6. Osteoporosis screening   7. Myalgia       Tests ordered Orders Placed This Encounter  Procedures   DG Bone Density   Cardio IQ Adv Lipid and Inflamm Pnl   Estradiol   Progesterone   Testos,Total,Free and SHBG (Female)   CK     Plan: 1. She will continue with the same dose of NP thyroid and we may consider further optimizing the dose. 2. I am going to check her cardio IQ lipid lipid panel with inflammatory panel to see if we really need to be aggressive with her hyperlipidemia her or she can do without statin therapy. 3. As far as the myalgia is concerned, I am wondering whether statin therapy is causing this and we will check a CK level. 4. She needs osteoporosis screening which she has not had for a very long time, many years so we will check her bone density scan. 5. For menopausal state and symptoms, she will continue with estradiol, progesterone and testosterone therapy and we will check levels of all these hormones today. 6. I agreed with her that hypnosis may actually help her quit smoking ,we also discussed the possibility of using acupuncture. 7. Follow-up in 3 weeks to discuss all results and further recommendations.   No orders of the defined types were placed in this encounter.   Doree Albee, MD

## 2020-01-29 ENCOUNTER — Other Ambulatory Visit (HOSPITAL_BASED_OUTPATIENT_CLINIC_OR_DEPARTMENT_OTHER): Payer: 59

## 2020-01-29 MED FILL — PROGESTERONE MICRONIZED 200: 200 | 30 days supply | Qty: 60 | Fill #2

## 2020-01-29 MED FILL — ESTRADIOL 1 MG TABS: 1 | 30 days supply | Qty: 45 | Fill #2

## 2020-01-30 LAB — CARDIO IQ ADV LIPID AND INFLAMM PNL
Apolipoprotein B: 121 mg/dL — ABNORMAL HIGH (ref <90)
Cholesterol: 236 mg/dL — ABNORMAL HIGH (ref <200)
HDL: 52 mg/dL (ref >49)
LDL Cholesterol (Calc): 157 mg/dL (calc) — ABNORMAL HIGH (ref <100)
LDL Large: 6773 nmol/L (ref >6729)
LDL Medium: 444 nmol/L — ABNORMAL HIGH (ref <215)
LDL Particle Number: 1799 nmol/L — ABNORMAL HIGH (ref <1138)
LDL Peak Size: 219.9 Angstrom — ABNORMAL LOW (ref >222.9)
LDL Small: 278 nmol/L — ABNORMAL HIGH (ref <142)
Lipoprotein (a): <10 nmol/L (ref <75)
Non-HDL Cholesterol (Calc): 184 mg/dL (calc) — ABNORMAL HIGH (ref <130)
PLAC: 117 nmol/min/mL (ref <124)
Total CHOL/HDL Ratio: 4.5 calc — ABNORMAL HIGH (ref <3.6)
Triglycerides: 146 mg/dL (ref <150)
hs-CRP: 3.7 mg/L — ABNORMAL HIGH (ref <1.0)

## 2020-01-30 LAB — TESTOS,TOTAL,FREE AND SHBG (FEMALE)
Free Testosterone: 4.4 pg/mL (ref 0.1–6.4)
Sex Hormone Binding: 106 nmol/L (ref 17–124)
Testosterone, Total, LC-MS-MS: 53 ng/dL — ABNORMAL HIGH (ref 2–45)

## 2020-01-30 LAB — PROGESTERONE: Progesterone: 26.2 ng/mL

## 2020-01-30 LAB — CK: Total CK: 47 U/L (ref 29–143)

## 2020-01-30 LAB — ESTRADIOL: Estradiol: 32 pg/mL

## 2020-02-01 ENCOUNTER — Other Ambulatory Visit: Payer: Self-pay

## 2020-02-01 ENCOUNTER — Encounter (INDEPENDENT_AMBULATORY_CARE_PROVIDER_SITE_OTHER): Payer: Self-pay | Admitting: Internal Medicine

## 2020-02-01 ENCOUNTER — Ambulatory Visit (HOSPITAL_BASED_OUTPATIENT_CLINIC_OR_DEPARTMENT_OTHER)
Admission: RE | Admit: 2020-02-01 | Discharge: 2020-02-01 | Disposition: A | Discharge status: Home / Self Care | Payer: 59 | Source: Ambulatory Visit | Attending: Internal Medicine | Admitting: Internal Medicine

## 2020-02-01 DIAGNOSIS — Z780 Asymptomatic menopausal state: Secondary | ICD-10-CM | POA: Diagnosis not present

## 2020-02-01 DIAGNOSIS — E2839 Other primary ovarian failure: Secondary | ICD-10-CM | POA: Insufficient documentation

## 2020-02-01 DIAGNOSIS — Z13820 Encounter for screening for osteoporosis: Secondary | ICD-10-CM | POA: Insufficient documentation

## 2020-02-15 ENCOUNTER — Other Ambulatory Visit: Payer: Self-pay

## 2020-02-15 ENCOUNTER — Other Ambulatory Visit (INDEPENDENT_AMBULATORY_CARE_PROVIDER_SITE_OTHER): Payer: Self-pay | Admitting: Internal Medicine

## 2020-02-15 ENCOUNTER — Ambulatory Visit (INDEPENDENT_AMBULATORY_CARE_PROVIDER_SITE_OTHER): Payer: 59 | Admitting: Internal Medicine

## 2020-02-15 ENCOUNTER — Encounter (INDEPENDENT_AMBULATORY_CARE_PROVIDER_SITE_OTHER): Payer: Self-pay | Admitting: Internal Medicine

## 2020-02-15 VITALS — BP 120/79 | HR 69 | Temp 97.7°F | Ht 64.0 in | Wt 140.0 lb

## 2020-02-15 DIAGNOSIS — M545 Low back pain, unspecified: Secondary | ICD-10-CM

## 2020-02-15 DIAGNOSIS — E2839 Other primary ovarian failure: Secondary | ICD-10-CM

## 2020-02-15 DIAGNOSIS — E039 Hypothyroidism, unspecified: Secondary | ICD-10-CM

## 2020-02-15 DIAGNOSIS — E785 Hyperlipidemia, unspecified: Secondary | ICD-10-CM

## 2020-02-15 MED ORDER — THYROID 120 MG PO TABS
120.0000 mg | ORAL_TABLET | Freq: Every day | ORAL | 3 refills | Status: DC
Start: 2020-02-15 — End: 2020-03-21

## 2020-02-15 MED FILL — NP THYROID 120 MG TABLET: 120 | 30 days supply | Qty: 30 | Fill #0

## 2020-02-15 NOTE — Progress Notes (Signed)
Metrics: Intervention Frequency ACO  Documented Smoking Status Yearly  Screened one or more times in 24 months  Cessation Counseling or  Active cessation medication Past 24 months  Past 24 months   Guideline developer: UpToDate (See UpToDate for funding source) Date Released: 2014       Wellness Office Visit  Subjective:  Patient ID: Kathleen Luna, female    DOB: 1965-12-09  Age: 54 y.o. MRN: [id number]  CC: This lady comes in for follow-up of her blood work that was done couple of weeks ago and further recommendations. HPI  I discussed all results with her.  Her estradiol level is still suboptimal.  Progesterone level is in a very good range.  We looked at her cardio IQ lipid panel and this shows increasing total cholesterol but also increased LDL particle number.  These need to improve. She is also complaining of low back pain and she has had problems with this for many years.  She does see orthopedics, Dr. Rolena Infante and has had procedures done.  She is taking Advil over-the-counter.  She wonders if there is anything else that can be done. Past Medical History:  Diagnosis Date  . Headache(784.0)    otc med prn  . Hyperlipidemia   . Hypothyroidism   . Hypothyroidism, adult 06/02/2019  . Primary ovarian failure 06/02/2019  . SVD (spontaneous vaginal delivery)    x 1  . Vitamin D deficiency disease 06/02/2019   Past Surgical History:  Procedure Laterality Date  . BREAST SURGERY  09/2010   augmentation  . VULVAR LESION REMOVAL  01/17/2012   Procedure: VULVAR LESION;  Surgeon: Allena Katz, MD;  Location: [facility];  Service: Gynecology;  Laterality: N/A;  incision and drainage  of perineal abscess  . WISDOM TOOTH EXTRACTION       Family History  Problem Relation Age of Onset  . Hyperlipidemia Mother   . Hypertension Mother   . Dementia Mother   . Early death Father   . Heart attack Father   . Cancer Sister   . Heart disease Sister   . Cancer Brother   . 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   Social History   Tobacco Use  . Smoking status: Current Every Day Smoker    Packs/day: 1.00    Years: 15.00    Pack years: 15.00    Types: Cigarettes    Last attempt to quit: 07/07/1997    Years since quitting: 22.6  . Smokeless tobacco: Never Used  . Tobacco comment: RESTART SMOKING  Substance Use Topics  . Alcohol use: Yes    Comment: weekends    Current Meds  Medication Sig  . Ascorbic Acid (VITAMIN C) 1000 MG tablet Take 1,000 mg by mouth daily.  Marland Kitchen atorvastatin (LIPITOR) 40 MG tablet TAKE 1 TABLET BY MOUTH ONCE DAILY  . Cholecalciferol (VITAMIN D-3) 125 MCG (5000 UT) TABS Take 2 tablets by mouth daily.  Marland Kitchen estradiol (ESTRACE) 1 MG tablet Take 1.5 tablets (1.5 mg total) by mouth daily.  Marland Kitchen ibuprofen (ADVIL) 800 MG tablet Take 1 tablet (800 mg total) by mouth 3 (three) times daily.  . NP THYROID 90 MG tablet TAKE 1 TABLET BY MOUTH ONCE DAILY  . progesterone (PROMETRIUM) 200 MG capsule Take 2 capsules (400 mg total) by mouth daily.  . TESTOSTERONE CYPIONATE IM Inject 5 mg into  the muscle 2 (two) times a week. COMPOUNDED       Depression screen [facility] 2/9 01/26/2020 11/09/2019  Decreased Interest 0 0  Down, Depressed, Hopeless 0 0  PHQ - 2 Score 0 0     Objective:   Today's Vitals: BP 120/79 (BP Location: Left Arm, Patient Position: Sitting, Cuff Size: Normal)   Pulse 69   Temp 97.7 F (36.5 C) (Temporal)   Ht 5\' 4"  (1.626 m)   Wt 140 lb (63.5 kg)   SpO2 97%   BMI 24.03 kg/m  Vitals with BMI 02/15/2020 01/26/2020 12/03/2019  Height 5\' 4"  5\' 4"  5\' 4"   Weight 140 lbs 140 lbs 13 oz 140 lbs  BMI 24.02 28.76 81.15  Systolic 726 203 559  Diastolic 79 90 86  Pulse 69 87 80     Physical Exam  She looks systemically well.  Her weight is stable.  Blood pressure is reasonable.     Assessment   1.  Dyslipidemia   2. Hypothyroidism, adult   3. Midline low back pain without sciatica, unspecified chronicity   4. Primary ovarian failure       Tests ordered No orders of the defined types were placed in this encounter.    Plan: 1. As far as her hyperlipidemia is concerned, eventually, I would like her to be off statin therapy but in the meantime she will try to do more of a plant-based diet.  She already does intermittent fasting every day and usually has only 1-2 meals a day. 2. Although her T3 levels were in a good range, I think we can further optimize them for further benefit and she is agreeable to this.  I am going to send a new prescription for NP thyroid 120 mg daily.  She will let me know if she has side effects. 3. Her estradiol is not optimized still and I have told her to increase the estradiol dose to 2 mg every day now.  She will let me know when she runs out of her medications and we will then prescribe estradiol 2 mg tablet, take 1 daily. 4. Apart from the Advil, which she should limit for her low back pain, I am going to simply recommend Tylenol 650 mg tablets, take 2 daily when needed.  She can take a maximum of 6 tablets in a day.  She will probably need to get in touch with orthopedics, Dr. Rolena Infante again. 5. Follow-up with me in the next 6 to 8 weeks to see how she is doing and we will do blood work then.   Meds ordered this encounter  Medications  . thyroid (NP THYROID) 120 MG tablet    Sig: Take 1 tablet (120 mg total) by mouth daily before breakfast.    Dispense:  30 tablet    Refill:  3    Dannae Kato Luther Parody, MD

## 2020-02-19 DIAGNOSIS — M545 Low back pain: Secondary | ICD-10-CM | POA: Diagnosis not present

## 2020-02-19 DIAGNOSIS — G5702 Lesion of sciatic nerve, left lower limb: Secondary | ICD-10-CM | POA: Insufficient documentation

## 2020-02-19 DIAGNOSIS — M5432 Sciatica, left side: Secondary | ICD-10-CM | POA: Diagnosis not present

## 2020-02-22 MED FILL — GABAPENTIN 300 MG CAPSULE: 300 | 15 days supply | Qty: 15 | Fill #0

## 2020-02-24 MED FILL — GABAPENTIN 100 MG CAPSULE: 100 | 30 days supply | Qty: 90 | Fill #0

## 2020-02-25 DIAGNOSIS — M5432 Sciatica, left side: Secondary | ICD-10-CM | POA: Diagnosis not present

## 2020-03-04 MED FILL — PROGESTERONE MICRONIZED 200: 200 | 30 days supply | Qty: 60 | Fill #3

## 2020-03-04 MED FILL — ESTRADIOL 1 MG TABS: 1 | 30 days supply | Qty: 45 | Fill #3

## 2020-03-21 ENCOUNTER — Other Ambulatory Visit (INDEPENDENT_AMBULATORY_CARE_PROVIDER_SITE_OTHER): Payer: Self-pay

## 2020-03-21 ENCOUNTER — Other Ambulatory Visit (INDEPENDENT_AMBULATORY_CARE_PROVIDER_SITE_OTHER): Payer: Self-pay | Admitting: Internal Medicine

## 2020-03-21 MED ORDER — THYROID 60 MG PO TABS: 120.0000 mg | ORAL_TABLET | Freq: Every day | ORAL | 3 refills | Status: DC

## 2020-03-21 MED FILL — GABAPENTIN 100 MG CAPSULE: 100 | 30 days supply | Qty: 90 | Fill #0

## 2020-03-21 MED FILL — NP THYROID 60 MG TABLET: 60 | 30 days supply | Qty: 60 | Fill #0

## 2020-03-21 MED FILL — ATORVASTATIN CALCIUM 40 MG: 40 | 30 days supply | Qty: 30 | Fill #3

## 2020-03-31 ENCOUNTER — Other Ambulatory Visit (INDEPENDENT_AMBULATORY_CARE_PROVIDER_SITE_OTHER): Payer: Self-pay | Admitting: Internal Medicine

## 2020-03-31 MED FILL — ESTRADIOL 1 MG TABS: 1 | 30 days supply | Qty: 45 | Fill #0

## 2020-04-05 ENCOUNTER — Ambulatory Visit (INDEPENDENT_AMBULATORY_CARE_PROVIDER_SITE_OTHER): Payer: 59 | Admitting: Internal Medicine

## 2020-04-06 ENCOUNTER — Other Ambulatory Visit (INDEPENDENT_AMBULATORY_CARE_PROVIDER_SITE_OTHER): Payer: Self-pay | Admitting: Internal Medicine

## 2020-04-06 MED FILL — PROGESTERONE 200 MG CAPS: 200 | 30 days supply | Qty: 60 | Fill #0

## 2020-04-07 ENCOUNTER — Encounter (INDEPENDENT_AMBULATORY_CARE_PROVIDER_SITE_OTHER): Payer: Self-pay | Admitting: Internal Medicine

## 2020-04-12 ENCOUNTER — Ambulatory Visit (INDEPENDENT_AMBULATORY_CARE_PROVIDER_SITE_OTHER): Payer: 59 | Admitting: Internal Medicine

## 2020-04-12 ENCOUNTER — Other Ambulatory Visit (INDEPENDENT_AMBULATORY_CARE_PROVIDER_SITE_OTHER): Payer: Self-pay | Admitting: Internal Medicine

## 2020-04-12 DIAGNOSIS — Z20822 Contact with and (suspected) exposure to covid-19: Secondary | ICD-10-CM

## 2020-04-13 ENCOUNTER — Encounter (INDEPENDENT_AMBULATORY_CARE_PROVIDER_SITE_OTHER): Payer: Self-pay | Admitting: Internal Medicine

## 2020-04-26 ENCOUNTER — Other Ambulatory Visit (INDEPENDENT_AMBULATORY_CARE_PROVIDER_SITE_OTHER): Payer: Self-pay | Admitting: Internal Medicine

## 2020-04-26 ENCOUNTER — Other Ambulatory Visit (INDEPENDENT_AMBULATORY_CARE_PROVIDER_SITE_OTHER): Payer: Self-pay | Admitting: Nurse Practitioner

## 2020-04-26 MED FILL — ESTRADIOL 1 MG TABS: 1 | 30 days supply | Qty: 45 | Fill #0

## 2020-05-09 ENCOUNTER — Other Ambulatory Visit (INDEPENDENT_AMBULATORY_CARE_PROVIDER_SITE_OTHER): Payer: Self-pay | Admitting: Internal Medicine

## 2020-05-09 ENCOUNTER — Ambulatory Visit: Payer: 59 | Attending: Internal Medicine

## 2020-05-09 ENCOUNTER — Encounter (INDEPENDENT_AMBULATORY_CARE_PROVIDER_SITE_OTHER): Payer: Self-pay | Admitting: Internal Medicine

## 2020-05-09 DIAGNOSIS — N390 Urinary tract infection, site not specified: Secondary | ICD-10-CM

## 2020-05-09 DIAGNOSIS — Z23 Encounter for immunization: Secondary | ICD-10-CM

## 2020-05-09 MED ORDER — CIPROFLOXACIN HCL 500 MG PO TABS
500.0000 mg | ORAL_TABLET | Freq: Two times a day (BID) | ORAL | 0 refills | Status: DC
Start: 2020-05-09 — End: 2020-05-31

## 2020-05-09 MED FILL — PROGESTERONE 200 MG CAPS: 200 | 30 days supply | Qty: 60 | Fill #1

## 2020-05-09 MED FILL — CIPROFLOXACIN HCL 500 MG TA: 500 | 3 days supply | Qty: 6 | Fill #0

## 2020-05-09 NOTE — Progress Notes (Signed)
   Covid-19 Vaccination Clinic  Name:  Kathleen Luna    MRN: [id number] DOB: Apr 06, 1966  05/09/2020  [name] was observed post Covid-19 immunization for 15 minutes without incident. She was provided with Vaccine Information Sheet and instruction to access the V-Safe system.   [name] was instructed to call 911 with any severe reactions post vaccine: Marland Kitchen Difficulty breathing  . Swelling of face and throat  . A fast heartbeat  . A bad rash all over body  . Dizziness and weakness   Immunizations Administered    Name Date Dose VIS Date Route   Pfizer COVID-19 Vaccine 05/09/2020  9:21 AM 0.3 mL 10/21/2018 Intramuscular   Manufacturer: [employer]   Lot: [id number]   [place]: [id number]

## 2020-05-09 NOTE — Telephone Encounter (Signed)
Please advise 

## 2020-05-10 ENCOUNTER — Other Ambulatory Visit (INDEPENDENT_AMBULATORY_CARE_PROVIDER_SITE_OTHER): Payer: Self-pay | Admitting: Internal Medicine

## 2020-05-10 ENCOUNTER — Inpatient Hospital Stay: Payer: 59 | Attending: Internal Medicine

## 2020-05-10 DIAGNOSIS — N390 Urinary tract infection, site not specified: Secondary | ICD-10-CM | POA: Diagnosis not present

## 2020-05-11 ENCOUNTER — Other Ambulatory Visit (INDEPENDENT_AMBULATORY_CARE_PROVIDER_SITE_OTHER): Payer: Self-pay | Admitting: Internal Medicine

## 2020-05-11 ENCOUNTER — Telehealth (INDEPENDENT_AMBULATORY_CARE_PROVIDER_SITE_OTHER): Payer: Self-pay

## 2020-05-11 MED FILL — ATORVASTATIN 40 MG TABLET: 40 | 90 days supply | Qty: 90 | Fill #0

## 2020-05-11 MED FILL — NP THYROID 60 MG TABLET: 60 | 30 days supply | Qty: 60 | Fill #1

## 2020-05-11 NOTE — Telephone Encounter (Signed)
[facility]

## 2020-05-11 NOTE — Telephone Encounter (Signed)
Ok

## 2020-05-11 NOTE — Telephone Encounter (Signed)
I have not seen any results come through the fax machine.  If this test was ordered by Dr. Anastasio Champion I would assume the results to be faxed to [place], if the results were ordered by another provider she would have to call that provider for further information.  It can take up to 72 hours to receive urine culture results.  This may explain why there is been a delay and is receiving a fax.

## 2020-05-11 NOTE — Telephone Encounter (Signed)
I asked our phlebotomist and she tells me we do not have any record having received a urine sample yet. So I am not sure what happened, but the orders are placed we just need a sample to send off. Will you see if she can some by to provide a sample sometime today or tomorrow?

## 2020-05-12 ENCOUNTER — Other Ambulatory Visit (INDEPENDENT_AMBULATORY_CARE_PROVIDER_SITE_OTHER): Payer: Self-pay | Admitting: Nurse Practitioner

## 2020-05-12 DIAGNOSIS — N390 Urinary tract infection, site not specified: Secondary | ICD-10-CM

## 2020-05-12 DIAGNOSIS — R300 Dysuria: Secondary | ICD-10-CM

## 2020-05-12 LAB — URINALYSIS, ROUTINE W REFLEX MICROSCOPIC
Bilirubin, UA: NEGATIVE
Glucose, UA: NEGATIVE
Ketones, UA: NEGATIVE
Nitrite, UA: NEGATIVE
Protein,UA: NEGATIVE
RBC, UA: NEGATIVE
Specific Gravity, UA: 1.009 (ref 1.005–1.030)
Urobilinogen, Ur: 0.2 mg/dL (ref 0.2–1.0)
pH, UA: 6.5 (ref 5.0–7.5)

## 2020-05-12 LAB — MICROSCOPIC EXAMINATION
Bacteria, UA: NONE SEEN
Casts: NONE SEEN /lpf

## 2020-05-12 LAB — URINE CULTURE

## 2020-05-12 MED ORDER — NITROFURANTOIN MONOHYD MACRO 100 MG PO CAPS: 100.0000 mg | ORAL_CAPSULE | Freq: Two times a day (BID) | ORAL | 0 refills | Status: DC

## 2020-05-12 MED ORDER — FLUCONAZOLE 150 MG PO TABS: ORAL_TABLET | ORAL | 0 refills | Status: DC

## 2020-05-12 MED FILL — FLUCONAZOLE 150 MG TABS: 150 | 3 days supply | Qty: 2 | Fill #0

## 2020-05-12 MED FILL — NITROFURANTOIN MONO-MCR 100: 100 | 5 days supply | Qty: 10 | Fill #0

## 2020-05-12 NOTE — Progress Notes (Signed)
Patient called today with questions regarding urine results. Unfortunately urine culture has not yet resulted in the system. She has taken a course of ciprofloxacin and urinary symptoms are improving, but she feels she may be developing a vaginal yeast infection. Will send Rx for diflucan and as she reports some lingering mild symptoms of UTI will empirically treat wit a course of macrobid. Patient told to call if symptoms continue to persist.

## 2020-05-13 ENCOUNTER — Ambulatory Visit: Payer: 59

## 2020-05-16 MED FILL — PFIZER-BIONTECH COVID-19 VA: 30 | 1 days supply | Qty: 0 | Fill #0

## 2020-05-23 MED FILL — ESTRADIOL 1 MG TABS: 1 | 30 days supply | Qty: 45 | Fill #1

## 2020-05-31 ENCOUNTER — Ambulatory Visit (INDEPENDENT_AMBULATORY_CARE_PROVIDER_SITE_OTHER): Payer: 59 | Admitting: Internal Medicine

## 2020-05-31 ENCOUNTER — Encounter (INDEPENDENT_AMBULATORY_CARE_PROVIDER_SITE_OTHER): Payer: Self-pay | Admitting: Internal Medicine

## 2020-05-31 ENCOUNTER — Other Ambulatory Visit: Payer: Self-pay

## 2020-05-31 VITALS — BP 120/74 | HR 87 | Temp 97.6°F | Resp 18 | Ht 64.0 in | Wt 136.0 lb

## 2020-05-31 DIAGNOSIS — E2839 Other primary ovarian failure: Secondary | ICD-10-CM | POA: Diagnosis not present

## 2020-05-31 DIAGNOSIS — E785 Hyperlipidemia, unspecified: Secondary | ICD-10-CM

## 2020-05-31 DIAGNOSIS — E039 Hypothyroidism, unspecified: Secondary | ICD-10-CM

## 2020-05-31 MED ORDER — NP THYROID 120 MG PO TABS: 120.0000 mg | ORAL_TABLET | Freq: Every day | ORAL | 3 refills | Status: DC

## 2020-05-31 MED ORDER — ESTRADIOL 2 MG PO TABS: 2.0000 mg | ORAL_TABLET | Freq: Every day | ORAL | 3 refills | Status: DC

## 2020-05-31 NOTE — Progress Notes (Signed)
Metrics: Intervention Frequency ACO  Documented Smoking Status Yearly  Screened one or more times in 24 months  Cessation Counseling or  Active cessation medication Past 24 months  Past 24 months   Guideline developer: UpToDate (See UpToDate for funding source) Date Released: 2014       Wellness Office Visit  Subjective:  Patient ID: Kathleen Luna, female    DOB: [birth year]  Age: 54 y.o. MRN: [id number]  CC: This lady comes in for follow-up of bioidentical hormone therapy, hypothyroidism and hyperlipidemia. HPI  She continues on statin therapy and she really does not want to continue with this but her cholesterol numbers were elevated on the last occasion. She does continue to take NP thyroid 90 mg daily but there was some miscommunication because I wanted her to increase the dose of NP thyroid but I do not think this was communicated and she continues on the NP thyroid 90 mg daily. Also, she continues on estradiol 1.5 mg daily and my intention was for her to be on estradiol 2 mg daily. She feels well on testosterone twice weekly injections. She continues with intermittent fasting on a daily basis and continues to exercise on a regular basis also. She feels great.  Her sleep is better. Past Medical History:  Diagnosis Date  . Headache(784.0)    otc med prn  . Hyperlipidemia   . Hypothyroidism   . Hypothyroidism, adult 06/02/2019  . Primary ovarian failure 06/02/2019  . SVD (spontaneous vaginal delivery)    x 1  . Vitamin D deficiency disease 06/02/2019   Past Surgical History:  Procedure Laterality Date  . BREAST SURGERY  09/2010   augmentation  . VULVAR LESION REMOVAL  01/17/2012   Procedure: VULVAR LESION;  Surgeon: Allena Katz, MD;  Location: [facility];  Service: Gynecology;  Laterality: N/A;  incision and drainage  of perineal abscess  . WISDOM TOOTH EXTRACTION       Family History  Problem Relation Age of Onset  . Hyperlipidemia Mother   . Hypertension Mother     . Dementia Mother   . Early death Father   . Heart attack Father   . Cancer Sister   . Heart disease Sister   . Cancer Brother   . 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   Social History   Tobacco Use  . Smoking status: Current Every Day Smoker    Packs/day: 1.00    Years: 15.00    Pack years: 15.00    Types: Cigarettes    Last attempt to quit: 07/07/1997    Years since quitting: 22.9  . Smokeless tobacco: Never Used  . Tobacco comment: RESTART SMOKING  Substance Use Topics  . Alcohol use: Yes    Comment: weekends    Current Meds  Medication Sig  . Ascorbic Acid (VITAMIN C) 1000 MG tablet Take 1,000 mg by mouth daily.  Marland Kitchen atorvastatin (LIPITOR) 40 MG tablet TAKE 1 TABLET BY MOUTH ONCE DAILY  . Cholecalciferol (VITAMIN D-3) 125 MCG (5000 UT) TABS Take 2 tablets by mouth daily.  Marland Kitchen estradiol (ESTRACE) 1 MG tablet TAKE 1&1/2 TABLETS (1.5 MG TOTAL) BY MOUTH DAILY.  . NP THYROID 90 MG tablet TAKE 1 TABLET BY MOUTH ONCE DAILY  . progesterone (PROMETRIUM) 200 MG capsule TAKE 2 CAPSULES (400 MG TOTAL) BY MOUTH DAILY.  . TESTOSTERONE CYPIONATE IM Inject  5 mg into the muscle 2 (two) times a week. COMPOUNDED   . [DISCONTINUED] ciprofloxacin (CIPRO) 500 MG tablet Take 1 tablet (500 mg total) by mouth 2 (two) times daily.  . [DISCONTINUED] fluconazole (DIFLUCAN) 150 MG tablet Take 1 tablet by mouth once. If symptoms persist for greater than 72 hours then take one additional tablet by mouth.  . [DISCONTINUED] ibuprofen (ADVIL) 800 MG tablet Take 1 tablet (800 mg total) by mouth 3 (three) times daily.  . [DISCONTINUED] nitrofurantoin, macrocrystal-monohydrate, (MACROBID) 100 MG capsule Take 1 capsule (100 mg total) by mouth 2 (two) times daily.  . [DISCONTINUED] thyroid (NP THYROID) 60 MG tablet Take 2 tablets (120 mg total) by mouth daily before  breakfast.      Depression screen [facility] 2/9 01/26/2020 11/09/2019  Decreased Interest 0 0  Down, Depressed, Hopeless 0 0  PHQ - 2 Score 0 0     Objective:   Today's Vitals: BP 120/74 (BP Location: Right Arm, Patient Position: Sitting, Cuff Size: Normal)   Pulse 87   Temp 97.6 F (36.4 C) (Temporal)   Resp 18   Ht 5\' 4"  (1.626 m)   Wt 136 lb (61.7 kg)   SpO2 98%   BMI 23.34 kg/m  Vitals with BMI 05/31/2020 02/15/2020 01/26/2020  Height 5\' 4"  5\' 4"  5\' 4"   Weight 136 lbs 140 lbs 140 lbs 13 oz  BMI 23.33 89.21 19.41  Systolic 740 814 481  Diastolic 74 79 90  Pulse 87 69 87     Physical Exam  She looks systemically well.  She has lost another 4 pounds in weight.  Blood pressure is excellent.     Assessment   1. Primary ovarian failure   2. Hypothyroidism, adult   3. Dyslipidemia       Tests ordered No orders of the defined types were placed in this encounter.    Plan: 1. She will increase estradiol to 2 mg daily and I have sent a new prescription.  She will continue on progesterone 40 mg at night and also testosterone twice a week. 2. She will increase NP thyroid 120 mg daily and I have sent a new prescription. 3. For the time being, she will continue with statin therapy. 4. I will see her in a couple of months time for follow-up visit when we will do all the blood work.   Meds ordered this encounter  Medications  . estradiol (ESTRACE) 2 MG tablet    Sig: Take 1 tablet (2 mg total) by mouth daily.    Dispense:  30 tablet    Refill:  3  . NP THYROID 120 MG tablet    Sig: Take 1 tablet (120 mg total) by mouth daily before breakfast.    Dispense:  30 tablet    Refill:  3    Lajuan Godbee Luther Parody, MD

## 2020-06-01 ENCOUNTER — Encounter (INDEPENDENT_AMBULATORY_CARE_PROVIDER_SITE_OTHER): Payer: Self-pay | Admitting: Internal Medicine

## 2020-06-01 ENCOUNTER — Other Ambulatory Visit (INDEPENDENT_AMBULATORY_CARE_PROVIDER_SITE_OTHER): Payer: Self-pay | Admitting: Internal Medicine

## 2020-06-01 MED ORDER — NP THYROID 120 MG PO TABS
120.0000 mg | ORAL_TABLET | Freq: Every day | ORAL | 3 refills | Status: DC
Start: 2020-06-01 — End: 2020-06-01

## 2020-06-01 MED ORDER — ESTRADIOL 2 MG PO TABS
2.0000 mg | ORAL_TABLET | Freq: Every day | ORAL | 3 refills | Status: DC
Start: 2020-06-01 — End: 2020-06-01

## 2020-06-01 MED FILL — NP THYROID 120 MG TABLET: 120 | 30 days supply | Qty: 30 | Fill #0

## 2020-06-08 ENCOUNTER — Encounter (INDEPENDENT_AMBULATORY_CARE_PROVIDER_SITE_OTHER): Payer: Self-pay | Admitting: Internal Medicine

## 2020-06-14 MED FILL — NP THYROID 120 MG TABLET: 120 | 30 days supply | Qty: 30 | Fill #0

## 2020-06-14 MED FILL — PROGESTERONE 200 MG CAPS: 200 | 30 days supply | Qty: 60 | Fill #2

## 2020-06-27 MED FILL — ESTRADIOL 2 MG TABLET: 2 | 30 days supply | Qty: 30 | Fill #0

## 2020-07-07 DIAGNOSIS — Z1231 Encounter for screening mammogram for malignant neoplasm of breast: Secondary | ICD-10-CM | POA: Diagnosis not present

## 2020-07-07 DIAGNOSIS — Z6822 Body mass index (BMI) 22.0-22.9, adult: Secondary | ICD-10-CM | POA: Diagnosis not present

## 2020-07-07 DIAGNOSIS — Z01419 Encounter for gynecological examination (general) (routine) without abnormal findings: Secondary | ICD-10-CM | POA: Diagnosis not present

## 2020-07-08 ENCOUNTER — Other Ambulatory Visit (HOSPITAL_BASED_OUTPATIENT_CLINIC_OR_DEPARTMENT_OTHER): Payer: Self-pay | Admitting: Orthopedic Surgery

## 2020-07-08 MED FILL — GABAPENTIN 100 MG CAPSULE: 100 | 30 days supply | Qty: 90 | Fill #0

## 2020-07-19 MED FILL — ESTRADIOL 1 MG TABS: 1 | 30 days supply | Qty: 45 | Fill #2

## 2020-07-19 MED FILL — NP THYROID 120 MG TABLET: 120 | 30 days supply | Qty: 30 | Fill #1

## 2020-07-25 ENCOUNTER — Encounter (INDEPENDENT_AMBULATORY_CARE_PROVIDER_SITE_OTHER): Payer: Self-pay | Admitting: Internal Medicine

## 2020-07-26 ENCOUNTER — Other Ambulatory Visit: Payer: Self-pay

## 2020-07-26 ENCOUNTER — Ambulatory Visit (INDEPENDENT_AMBULATORY_CARE_PROVIDER_SITE_OTHER): Payer: 59 | Admitting: Family Medicine

## 2020-07-26 ENCOUNTER — Encounter: Payer: Self-pay | Admitting: Family Medicine

## 2020-07-26 ENCOUNTER — Ambulatory Visit: Payer: Self-pay

## 2020-07-26 VITALS — BP 131/83 | HR 82 | Ht 64.0 in | Wt 134.0 lb

## 2020-07-26 DIAGNOSIS — M1812 Unilateral primary osteoarthritis of first carpometacarpal joint, left hand: Secondary | ICD-10-CM

## 2020-07-26 DIAGNOSIS — M79645 Pain in left finger(s): Secondary | ICD-10-CM

## 2020-07-26 NOTE — Patient Instructions (Signed)
Good to see you Please use ice as needed  Please take aleve for 5 days straight  You can use the pennsaid after   Please send me a message in MyChart with any questions or updates.  Please see [place] back as needed. Let me know if the pain doesn't go away completely and we may need to try the brace or prednisone. .   --Dr. Raeford Razor

## 2020-07-26 NOTE — Assessment & Plan Note (Signed)
Has degenerative changes as well as an effusion and synovitis on exam.  Unclear if this is related any other inflammatory process.  No other joints are red or swollen. -Counseled on home exercise therapy and supportive care. -Counseled on Aleve. -Provided Pennsaid samples. -Could consider bracing or lab testing.

## 2020-07-26 NOTE — Progress Notes (Signed)
Medication Samples have been provided to the patient.  Drug name: Pennsaid      Strength: 2%        Qty: 2 boxes  LOT: [id number]  Exp.Date: 12/2020  Dosing instructions: Use a pea size amount and rub gently.  The patient has been instructed regarding the correct time, dose, and frequency of taking this medication, including desired effects and most common side effects.   Sherrie George, MA 3:35 PM 07/26/2020

## 2020-07-26 NOTE — Progress Notes (Signed)
Kathleen Luna - 54 y.o. female MRN [id number]  Date of birth: Mar 03, 1966  SUBJECTIVE:  Including CC & ROS.  No chief complaint on file.   Kathleen Luna is a 54 y.o. female that is presenting with acute left thumb tingling.  The pain is been ongoing for a few days.  No history of similar pain.  She is left-handed.  Has some swelling over the issue.   Review of Systems See HPI   HISTORY: Past Medical, Surgical, Social, and Family History Reviewed & Updated per EMR.   Pertinent Historical Findings include:  Past Medical History:  Diagnosis Date  . Headache(784.0)    otc med prn  . Hyperlipidemia   . Hypothyroidism   . Hypothyroidism, adult 06/02/2019  . Primary ovarian failure 06/02/2019  . SVD (spontaneous vaginal delivery)    x 1  . Vitamin D deficiency disease 06/02/2019    Past Surgical History:  Procedure Laterality Date  . BREAST SURGERY  09/2010   augmentation  . VULVAR LESION REMOVAL  01/17/2012   Procedure: VULVAR LESION;  Surgeon: Allena Katz, MD;  Location: [facility];  Service: Gynecology;  Laterality: N/A;  incision and drainage  of perineal abscess  . WISDOM TOOTH EXTRACTION      Family History  Problem Relation Age of Onset  . Hyperlipidemia Mother   . Hypertension Mother   . Dementia Mother   . Early death Father   . Heart attack Father   . Cancer Sister   . Heart disease Sister   . Cancer Brother   . Hyperlipidemia Daughter     Social History   Socioeconomic History  . Marital status: Single    Spouse name: Not on file  . Number of children: Not on file  . Years of education: Not on file  . Highest education level: Not on file  Occupational History  . Not on file  Tobacco Use  . Smoking status: Current Every Day Smoker    Packs/day: 1.00    Years: 15.00    Pack years: 15.00    Types: Cigarettes    Last attempt to quit: 07/07/1997    Years since quitting: 23.0  . Smokeless tobacco: Never Used  . Tobacco comment: RESTART SMOKING    Substance and Sexual Activity  . Alcohol use: Yes    Comment: weekends  . Drug use: No  . Sexual activity: Yes    Birth control/protection: I.U.D.  Other Topics Concern  . 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   Social Determinants of Health   Financial Resource Strain:   . Difficulty of Paying Living Expenses: Not on file  Food Insecurity:   . Worried About Charity fundraiser in the Last Year: Not on file  . Ran Out of Food in the Last Year: Not on file  Transportation Needs:   . Lack of Transportation (Medical): Not on file  . Lack of Transportation (Non-Medical): Not on file  Physical Activity:   . Days of Exercise per Week: Not on file  . Minutes of Exercise per Session: Not on file  Stress:   . Feeling of Stress : Not on file  Social Connections:   . Frequency of Communication with Friends and Family: Not on file  . Frequency of Social Gatherings with Friends and Family: Not on file  . Attends Religious Services: Not on file  .  Active Member of Clubs or Organizations: Not on file  . Attends Archivist Meetings: Not on file  . Marital Status: Not on file  Intimate Partner Violence:   . Fear of Current or Ex-Partner: Not on file  . Emotionally Abused: Not on file  . Physically Abused: Not on file  . Sexually Abused: Not on file     PHYSICAL EXAM:  VS: There were no vitals taken for this visit. Physical Exam Gen: NAD, alert, cooperative with exam, well-appearing MSK:  Left thumb: Swelling over the [facility] joint. Normal range of motion of the thumb. Positive grind test. Obvious bossing of the [facility] joint. Neurovascular intact  Limited ultrasound: Left thumb:  Normal-appearing proximal phalanx. Moderate degenerative changes of the [facility] joint.  Effusion noted on the dorsum.  Increased hyperemia of the synovium.  Summary: Effusion and synovitis  of the [facility] joint  Ultrasound and interpretation by Clearance Coots, MD    ASSESSMENT & PLAN:   Arthritis of carpometacarpal [facility]) joint of left thumb Has degenerative changes as well as an effusion and synovitis on exam.  Unclear if this is related any other inflammatory process.  No other joints are red or swollen. -Counseled on home exercise therapy and supportive care. -Counseled on Aleve. -Provided Pennsaid samples. -Could consider bracing or lab testing.

## 2020-07-28 ENCOUNTER — Encounter: Payer: Self-pay | Admitting: Family Medicine

## 2020-07-28 DIAGNOSIS — M1812 Unilateral primary osteoarthritis of first carpometacarpal joint, left hand: Secondary | ICD-10-CM | POA: Diagnosis not present

## 2020-07-28 MED FILL — PROGESTERONE MICRONIZED 200: 200 | 30 days supply | Qty: 60 | Fill #3

## 2020-08-03 ENCOUNTER — Encounter (INDEPENDENT_AMBULATORY_CARE_PROVIDER_SITE_OTHER): Payer: Self-pay | Admitting: Internal Medicine

## 2020-08-03 ENCOUNTER — Ambulatory Visit (INDEPENDENT_AMBULATORY_CARE_PROVIDER_SITE_OTHER): Payer: 59 | Admitting: Internal Medicine

## 2020-08-03 ENCOUNTER — Other Ambulatory Visit: Payer: Self-pay

## 2020-08-03 VITALS — BP 116/70 | HR 90 | Temp 98.1°F | Ht 64.0 in | Wt 135.4 lb

## 2020-08-03 DIAGNOSIS — E039 Hypothyroidism, unspecified: Secondary | ICD-10-CM

## 2020-08-03 DIAGNOSIS — E2839 Other primary ovarian failure: Secondary | ICD-10-CM | POA: Diagnosis not present

## 2020-08-03 DIAGNOSIS — E785 Hyperlipidemia, unspecified: Secondary | ICD-10-CM

## 2020-08-03 NOTE — Progress Notes (Signed)
Metrics: Intervention Frequency ACO  Documented Smoking Status Yearly  Screened one or more times in 24 months  Cessation Counseling or  Active cessation medication Past 24 months  Past 24 months   Guideline developer: UpToDate (See UpToDate for funding source) Date Released: 2014       Wellness Office Visit  Subjective:  Patient ID: Kathleen Luna, female    DOB: [birth year]  Age: 54 y.o. MRN: [id number]  CC: This delightful lady comes in for follow-up of bioidentical hormone therapy, hypothyroidism and dyslipidemia. HPI She tolerated the higher dose of estradiol and NP thyroid without any problems whatsoever. She continues to take statin therapy for previous history of hyperlipidemia.  She has no history of coronary artery disease or cerebrovascular disease.   Past Medical History:  Diagnosis Date  . Headache(784.0)    otc med prn  . Hyperlipidemia   . Hypothyroidism   . Hypothyroidism, adult 06/02/2019  . Primary ovarian failure 06/02/2019  . SVD (spontaneous vaginal delivery)    x 1  . Vitamin D deficiency disease 06/02/2019   Past Surgical History:  Procedure Laterality Date  . BREAST SURGERY  09/2010   augmentation  . VULVAR LESION REMOVAL  01/17/2012   Procedure: VULVAR LESION;  Surgeon: Allena Katz, MD;  Location: [facility];  Service: Gynecology;  Laterality: N/A;  incision and drainage  of perineal abscess  . WISDOM TOOTH EXTRACTION       Family History  Problem Relation Age of Onset  . Hyperlipidemia Mother   . Hypertension Mother   . Dementia Mother   . Early death Father   . Heart attack Father   . Cancer Sister   . Heart disease Sister   . Cancer Brother   . 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   Social History   Tobacco Use  . Smoking status: Current Every Day Smoker    Packs/day: 1.00    Years:  15.00    Pack years: 15.00    Types: Cigarettes    Last attempt to quit: 07/07/1997    Years since quitting: 23.0  . Smokeless tobacco: Never Used  . Tobacco comment: RESTART SMOKING  Substance Use Topics  . Alcohol use: Yes    Comment: weekends    Current Meds  Medication Sig  . Ascorbic Acid (VITAMIN C) 1000 MG tablet Take 1,000 mg by mouth daily.  Marland Kitchen atorvastatin (LIPITOR) 40 MG tablet TAKE 1 TABLET BY MOUTH ONCE DAILY  . Cholecalciferol (VITAMIN D-3) 125 MCG (5000 UT) TABS Take 2 tablets by mouth daily.  Marland Kitchen estradiol (ESTRACE) 2 MG tablet Take 1 tablet (2 mg total) by mouth daily.  Marland Kitchen gabapentin (NEURONTIN) 100 MG capsule Take 100 mg by mouth 3 (three) times daily.  . NP THYROID 120 MG tablet Take 1 tablet (120 mg total) by mouth daily before breakfast.  . progesterone (PROMETRIUM) 200 MG capsule TAKE 2 CAPSULES (400 MG TOTAL) BY MOUTH DAILY.  . TESTOSTERONE CYPIONATE IM Inject 5 mg into the muscle 2 (two) times a week. COMPOUNDED       Depression screen [facility] 2/9 01/26/2020 11/09/2019  Decreased Interest 0 0  Down, Depressed, Hopeless 0 0  PHQ - 2 Score 0 0     Objective:   Today's Vitals: BP 116/70   Pulse 90   Temp 98.1 F (36.7 C) (Temporal)  Ht 5\' 4"  (1.626 m)   Wt 135 lb 6.4 oz (61.4 kg)   SpO2 95%   BMI 23.24 kg/m  Vitals with BMI 08/03/2020 07/26/2020 05/31/2020  Height 5\' 4"  5\' 4"  5\' 4"   Weight 135 lbs 6 oz 134 lbs 136 lbs  BMI 23.23 88.67 73.73  Systolic 668 159 470  Diastolic 70 83 74  Pulse 90 82 87     Physical Exam  She looks systemically well.  Weight is very stable.  Blood pressure is excellent.     Assessment   1. Primary ovarian failure   2. Hypothyroidism, adult   3. Dyslipidemia       Tests ordered Orders Placed This Encounter  Procedures  . Estradiol  . Progesterone  . T3, free  . TSH     Plan: 1. She will continue with estradiol, progesterone and testosterone therapy.  We will check estradiol and progesterone levels  today. 2. She will continue with the higher dose of NP thyroid 120 mg daily and I will check thyroid function today. 3. We had a long discussion regarding statin therapy.  We discussed the benefits and side effects.  I discussed the medical literature which shows statin therapy increases insulin resistance, increases the risk of diabetes, increases by more than 30% the risk of breast cancer, increases risk of colon cancer and only reduces events by 2 to 3% and has no significant effect on mortality.  After this discussion and shared decision making, we agreed to discontinue her atorvastatin. 4. Further recommendations will depend on blood results and I will see her in about 3 months time for follow-up.   No orders of the defined types were placed in this encounter.   Doree Albee, MD

## 2020-08-04 ENCOUNTER — Encounter (INDEPENDENT_AMBULATORY_CARE_PROVIDER_SITE_OTHER): Payer: Self-pay | Admitting: Internal Medicine

## 2020-08-04 ENCOUNTER — Other Ambulatory Visit (INDEPENDENT_AMBULATORY_CARE_PROVIDER_SITE_OTHER): Payer: Self-pay | Admitting: Internal Medicine

## 2020-08-04 LAB — PROGESTERONE: Progesterone: 21 ng/mL

## 2020-08-04 LAB — ESTRADIOL: Estradiol: 33 pg/mL

## 2020-08-04 LAB — T3, FREE: T3, Free: 5 pg/mL — ABNORMAL HIGH (ref 2.3–4.2)

## 2020-08-04 LAB — TSH: TSH: <0.01 mIU/L — ABNORMAL LOW (ref 0.40–4.50)

## 2020-08-04 MED ORDER — NP THYROID 30 MG PO TABS: 30.0000 mg | ORAL_TABLET | Freq: Every day | ORAL | 3 refills | Status: DC

## 2020-08-04 MED FILL — NP THYROID 30 MG TABLET: 30 | 30 days supply | Qty: 30 | Fill #0

## 2020-08-11 DIAGNOSIS — M1812 Unilateral primary osteoarthritis of first carpometacarpal joint, left hand: Secondary | ICD-10-CM | POA: Diagnosis not present

## 2020-08-11 DIAGNOSIS — M65312 Trigger thumb, left thumb: Secondary | ICD-10-CM | POA: Insufficient documentation

## 2020-08-15 ENCOUNTER — Encounter (INDEPENDENT_AMBULATORY_CARE_PROVIDER_SITE_OTHER): Payer: Self-pay | Admitting: Internal Medicine

## 2020-08-15 MED FILL — NP THYROID 120 MG TABLET: 120 | 30 days supply | Qty: 30 | Fill #2

## 2020-08-15 MED FILL — ESTRADIOL 2 MG TABLET: 2 | 30 days supply | Qty: 30 | Fill #1

## 2020-09-15 ENCOUNTER — Other Ambulatory Visit (INDEPENDENT_AMBULATORY_CARE_PROVIDER_SITE_OTHER): Payer: Self-pay | Admitting: Internal Medicine

## 2020-09-15 MED FILL — NP THYROID 120 MG TABLET: 120 | 30 days supply | Qty: 30 | Fill #3

## 2020-09-15 MED FILL — NP THYROID 30 MG TABLET: 30 | 30 days supply | Qty: 30 | Fill #1

## 2020-09-15 MED FILL — PROGESTERONE 200 MG CAPS: 200 | 30 days supply | Qty: 60 | Fill #0

## 2020-09-15 MED FILL — ESTRADIOL 2 MG TABS: 2 | 30 days supply | Qty: 30 | Fill #2

## 2020-10-17 DIAGNOSIS — M1812 Unilateral primary osteoarthritis of first carpometacarpal joint, left hand: Secondary | ICD-10-CM | POA: Diagnosis not present

## 2020-10-17 DIAGNOSIS — M13842 Other specified arthritis, left hand: Secondary | ICD-10-CM | POA: Diagnosis not present

## 2020-10-20 MED FILL — NP THYROID 120 MG TABLET: 120 | 30 days supply | Qty: 30 | Fill #1

## 2020-10-20 MED FILL — ESTRADIOL 2 MG TABS: 2 | 30 days supply | Qty: 30 | Fill #3

## 2020-10-20 MED FILL — PROGESTERONE 200 MG CAPS: 200 | 30 days supply | Qty: 60 | Fill #1

## 2020-11-09 ENCOUNTER — Other Ambulatory Visit: Payer: Self-pay

## 2020-11-09 ENCOUNTER — Ambulatory Visit (INDEPENDENT_AMBULATORY_CARE_PROVIDER_SITE_OTHER): Payer: 59 | Admitting: Internal Medicine

## 2020-11-09 ENCOUNTER — Encounter (INDEPENDENT_AMBULATORY_CARE_PROVIDER_SITE_OTHER): Payer: Self-pay | Admitting: Internal Medicine

## 2020-11-09 ENCOUNTER — Other Ambulatory Visit (HOSPITAL_BASED_OUTPATIENT_CLINIC_OR_DEPARTMENT_OTHER): Payer: Self-pay

## 2020-11-09 VITALS — BP 118/78 | HR 91 | Temp 97.9°F | Ht 64.0 in | Wt 135.8 lb

## 2020-11-09 DIAGNOSIS — E785 Hyperlipidemia, unspecified: Secondary | ICD-10-CM

## 2020-11-09 DIAGNOSIS — R5383 Other fatigue: Secondary | ICD-10-CM

## 2020-11-09 DIAGNOSIS — R5381 Other malaise: Secondary | ICD-10-CM

## 2020-11-09 DIAGNOSIS — E039 Hypothyroidism, unspecified: Secondary | ICD-10-CM | POA: Diagnosis not present

## 2020-11-09 DIAGNOSIS — E2839 Other primary ovarian failure: Secondary | ICD-10-CM | POA: Diagnosis not present

## 2020-11-09 MED ORDER — NP THYROID 60 MG PO TABS
60.0000 mg | ORAL_TABLET | Freq: Every day | ORAL | 3 refills | Status: DC
  Filled 2020-11-09: qty 30, 30d supply, fill #0
  Filled 2021-03-15: qty 30, 30d supply, fill #1

## 2020-11-09 MED ORDER — ESTRADIOL 0.5 MG PO TABS
0.5000 mg | ORAL_TABLET | Freq: Every day | ORAL | 1 refills | Status: DC
  Filled 2020-11-09: qty 90, 90d supply, fill #0
  Filled 2021-03-03: qty 90, 90d supply, fill #1

## 2020-11-09 NOTE — Progress Notes (Signed)
Metrics: Intervention Frequency ACO  Documented Smoking Status Yearly  Screened one or more times in 24 months  Cessation Counseling or  Active cessation medication Past 24 months  Past 24 months   Guideline developer: UpToDate (See UpToDate for funding source) Date Released: 2014       Wellness Office Visit  Subjective:  Patient ID: Kathleen Luna, female    DOB: 04-24-1966  Age: 55 y.o. MRN: [id number]  CC: This delightful lady comes in for follow-up of hypothyroidism, menopausal symptoms and therapy of this with bioidentical hormones, dyslipidemia. HPI  She is complaining of feeling cold and somewhat fatigued.  She wonders if her iron levels are low. She has tolerated extra NP thyroid 30 mg dose at lunchtime.  Unfortunately, she did not increase estradiol dose to 3 mg daily. She continues to focus on intermittent fasting and try to eat healthy. She has cut down on smoking now to about half a pack of cigarettes per day. She continues on testosterone therapy twice a week. Past Medical History:  Diagnosis Date  . Headache(784.0)    otc med prn  . Hyperlipidemia   . Hypothyroidism   . Hypothyroidism, adult 06/02/2019  . Primary ovarian failure 06/02/2019  . SVD (spontaneous vaginal delivery)    x 1  . Vitamin D deficiency disease 06/02/2019   Past Surgical History:  Procedure Laterality Date  . BREAST SURGERY  09/2010   augmentation  . VULVAR LESION REMOVAL  01/17/2012   Procedure: VULVAR LESION;  Surgeon: Allena Katz, MD;  Location: [place];  Service: Gynecology;  Laterality: N/A;  incision and drainage  of perineal abscess  . WISDOM TOOTH EXTRACTION       Family History  Problem Relation Age of Onset  . Hyperlipidemia Mother   . Hypertension Mother   . Dementia Mother   . Early death Father   . Heart attack Father   . Cancer Sister   . Heart disease Sister   . Cancer Brother   . 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   Social History   Tobacco Use  . Smoking status: Current Every Day Smoker    Packs/day: 0.50    Years: 15.00    Pack years: 7.50    Types: Cigarettes    Last attempt to quit: 07/07/1997    Years since quitting: 23.3  . Smokeless tobacco: Never Used  . Tobacco comment: RESTART SMOKING  Substance Use Topics  . Alcohol use: Yes    Comment: weekends    Current Meds  Medication Sig  . Ascorbic Acid (VITAMIN C) 1000 MG tablet Take 1,000 mg by mouth daily.  . Cholecalciferol (VITAMIN D-3) 125 MCG (5000 UT) TABS Take 2 tablets by mouth daily.  Marland Kitchen estradiol (ESTRACE) 0.5 MG tablet Take 1 tablet (0.5 mg total) by mouth daily.  Marland Kitchen estradiol (ESTRACE) 2 MG tablet Take 1 tablet (2 mg total) by mouth daily.  Marland Kitchen gabapentin (NEURONTIN) 100 MG capsule Take 100 mg by mouth 3 (three) times daily.  . NP THYROID 120 MG tablet Take 1 tablet (120 mg total) by mouth daily before breakfast.  . NP THYROID 30 MG tablet Take 1 tablet (30 mg total) by mouth daily before breakfast.  . NP THYROID 60 MG tablet Take 1 tablet (60 mg total) by mouth daily before breakfast.  . progesterone (PROMETRIUM) 200 MG  capsule TAKE 2 CAPSULES (400 MG TOTAL) BY MOUTH DAILY.  . TESTOSTERONE CYPIONATE IM Inject 5 mg into the muscle 2 (two) times a week. COMPOUNDED     [place] Office Visit from 11/09/2020 in [facility]  PHQ-9 Total Score 0      Objective:   Today's Vitals: BP 118/78   Pulse 91   Temp 97.9 F (36.6 C) (Temporal)   Ht 5\' 4"  (1.626 m)   Wt 135 lb 12.8 oz (61.6 kg)   SpO2 99%   BMI 23.31 kg/m  Vitals with BMI 11/09/2020 08/03/2020 07/26/2020  Height 5\' 4"  5\' 4"  5\' 4"   Weight 135 lbs 13 oz 135 lbs 6 oz 134 lbs  BMI 23.3 88.28 00.34  Systolic 917 915 056  Diastolic 78 70 83  Pulse 91 90 82     Physical Exam  She looks systemically well.  Weight is unchanged.  Blood pressure  is excellent.     Assessment   1. Hypothyroidism, adult   2. Primary ovarian failure   3. Dyslipidemia   4. Malaise and fatigue       Tests ordered Orders Placed This Encounter  Procedures  . CBC  . Ferritin  . Lipid panel  . Iron,Total/Total Iron Binding Cap  . Testos,Total,Free and SHBG (Female)     Plan: 1. I think in view of her tolerance of thyroid and the need for optimization, I wanted to increase the lunchtime dose of NP thyroid to 60 mg daily and I sent a new prescription.  Therefore, she will take NP thyroid 120 mg in the morning and NP thyroid 60 mg at lunchtime. 2. We will further optimize estradiol so I have sent a new prescription of estradiol 0.5 mg daily which she will take in addition to the estradiol 2 mg daily that she is taking. 3. Blood work is ordered. 4. Further recommendations will depend on results and I will see her at the end of May for follow-up.   Meds ordered this encounter  Medications  . estradiol (ESTRACE) 0.5 MG tablet    Sig: Take 1 tablet (0.5 mg total) by mouth daily.    Dispense:  90 tablet    Refill:  1  . NP THYROID 60 MG tablet    Sig: Take 1 tablet (60 mg total) by mouth daily before breakfast.    Dispense:  30 tablet    Refill:  3    Nimish Luther Parody, MD

## 2020-11-10 ENCOUNTER — Other Ambulatory Visit (HOSPITAL_BASED_OUTPATIENT_CLINIC_OR_DEPARTMENT_OTHER): Payer: Self-pay

## 2020-11-11 ENCOUNTER — Other Ambulatory Visit (HOSPITAL_BASED_OUTPATIENT_CLINIC_OR_DEPARTMENT_OTHER): Payer: Self-pay

## 2020-11-16 LAB — CBC
HCT: 36.6 % (ref 35.0–45.0)
Hemoglobin: 12.2 g/dL (ref 11.7–15.5)
MCH: 29.6 pg (ref 27.0–33.0)
MCHC: 33.3 g/dL (ref 32.0–36.0)
MCV: 88.8 fL (ref 80.0–100.0)
MPV: 9.8 fL (ref 7.5–12.5)
Platelets: 295 10*3/uL (ref 140–400)
RBC: 4.12 10*6/uL (ref 3.80–5.10)
RDW: 12.8 % (ref 11.0–15.0)
WBC: 10.6 10*3/uL (ref 3.8–10.8)

## 2020-11-16 LAB — LIPID PANEL
Cholesterol: 314 mg/dL — ABNORMAL HIGH (ref <200)
HDL: 52 mg/dL (ref >=50)
LDL Cholesterol (Calc): 241 mg/dL (calc) — ABNORMAL HIGH
Non-HDL Cholesterol (Calc): 262 mg/dL (calc) — ABNORMAL HIGH (ref <130)
Total CHOL/HDL Ratio: 6 (calc) — ABNORMAL HIGH (ref <5.0)
Triglycerides: 93 mg/dL (ref <150)

## 2020-11-16 LAB — IRON, TOTAL/TOTAL IRON BINDING CAP
%SAT: 24 % (calc) (ref 16–45)
Iron: 71 ug/dL (ref 45–160)
TIBC: 300 mcg/dL (calc) (ref 250–450)

## 2020-11-16 LAB — FERRITIN: Ferritin: 97 ng/mL (ref 16–232)

## 2020-11-16 LAB — TESTOS,TOTAL,FREE AND SHBG (FEMALE)
Free Testosterone: 1.3 pg/mL (ref 0.1–6.4)
Sex Hormone Binding: 160 nmol/L — ABNORMAL HIGH (ref 14–73)
Testosterone, Total, LC-MS-MS: 24 ng/dL (ref 2–45)

## 2020-11-21 NOTE — Progress Notes (Signed)
Please call the patient and let her know what I wrote on my chart.  Thanks.

## 2020-11-24 NOTE — Progress Notes (Signed)
Pt was called. Ask if she would go into mychart to review & reply back she agrees to ne changes. Pt is going on inter net now.

## 2020-11-26 ENCOUNTER — Other Ambulatory Visit (HOSPITAL_BASED_OUTPATIENT_CLINIC_OR_DEPARTMENT_OTHER): Payer: Self-pay

## 2020-11-28 ENCOUNTER — Other Ambulatory Visit (INDEPENDENT_AMBULATORY_CARE_PROVIDER_SITE_OTHER): Payer: Self-pay | Admitting: Internal Medicine

## 2020-11-28 ENCOUNTER — Encounter (INDEPENDENT_AMBULATORY_CARE_PROVIDER_SITE_OTHER): Payer: Self-pay | Admitting: Internal Medicine

## 2020-11-28 ENCOUNTER — Other Ambulatory Visit (HOSPITAL_BASED_OUTPATIENT_CLINIC_OR_DEPARTMENT_OTHER): Payer: Self-pay

## 2020-11-28 MED ORDER — NP THYROID 120 MG PO TABS
ORAL_TABLET | ORAL | 3 refills | Status: DC
  Filled 2020-11-28 – 2020-12-15 (×3): qty 30, 30d supply, fill #0
  Filled 2021-02-09 – 2021-02-16 (×2): qty 30, 30d supply, fill #1
  Filled 2021-03-15: qty 30, 30d supply, fill #2
  Filled 2021-04-11: qty 30, 30d supply, fill #3

## 2020-11-28 MED FILL — Progesterone Cap 200 MG: ORAL | 30 days supply | Qty: 60 | Fill #0 | Status: AC

## 2020-11-29 ENCOUNTER — Other Ambulatory Visit (HOSPITAL_BASED_OUTPATIENT_CLINIC_OR_DEPARTMENT_OTHER): Payer: Self-pay

## 2020-11-29 ENCOUNTER — Other Ambulatory Visit (INDEPENDENT_AMBULATORY_CARE_PROVIDER_SITE_OTHER): Payer: Self-pay | Admitting: Internal Medicine

## 2020-11-29 MED ORDER — ESTRADIOL 2 MG PO TABS
2.0000 mg | ORAL_TABLET | Freq: Every day | ORAL | 1 refills | Status: DC
  Filled 2020-11-29: qty 90, 90d supply, fill #0
  Filled 2021-03-03: qty 90, 90d supply, fill #1

## 2020-11-30 ENCOUNTER — Other Ambulatory Visit (HOSPITAL_BASED_OUTPATIENT_CLINIC_OR_DEPARTMENT_OTHER): Payer: Self-pay

## 2020-12-05 DIAGNOSIS — M25562 Pain in left knee: Secondary | ICD-10-CM | POA: Diagnosis not present

## 2020-12-05 DIAGNOSIS — M25571 Pain in right ankle and joints of right foot: Secondary | ICD-10-CM | POA: Diagnosis not present

## 2020-12-07 ENCOUNTER — Other Ambulatory Visit (HOSPITAL_BASED_OUTPATIENT_CLINIC_OR_DEPARTMENT_OTHER): Payer: Self-pay

## 2020-12-08 ENCOUNTER — Other Ambulatory Visit (HOSPITAL_BASED_OUTPATIENT_CLINIC_OR_DEPARTMENT_OTHER): Payer: Self-pay

## 2020-12-08 MED ORDER — FLUCONAZOLE 150 MG PO TABS
150.0000 mg | ORAL_TABLET | Freq: Every day | ORAL | 0 refills | Status: DC
  Filled 2020-12-08: qty 2, 2d supply, fill #0

## 2020-12-13 ENCOUNTER — Other Ambulatory Visit (HOSPITAL_BASED_OUTPATIENT_CLINIC_OR_DEPARTMENT_OTHER): Payer: Self-pay

## 2020-12-15 ENCOUNTER — Other Ambulatory Visit (HOSPITAL_BASED_OUTPATIENT_CLINIC_OR_DEPARTMENT_OTHER): Payer: Self-pay

## 2020-12-16 ENCOUNTER — Other Ambulatory Visit (HOSPITAL_BASED_OUTPATIENT_CLINIC_OR_DEPARTMENT_OTHER): Payer: Self-pay

## 2021-01-04 DIAGNOSIS — M25571 Pain in right ankle and joints of right foot: Secondary | ICD-10-CM | POA: Diagnosis not present

## 2021-01-10 ENCOUNTER — Other Ambulatory Visit (HOSPITAL_BASED_OUTPATIENT_CLINIC_OR_DEPARTMENT_OTHER): Payer: Self-pay

## 2021-01-10 MED FILL — Progesterone Cap 200 MG: ORAL | 30 days supply | Qty: 60 | Fill #1 | Status: AC

## 2021-01-11 ENCOUNTER — Encounter (INDEPENDENT_AMBULATORY_CARE_PROVIDER_SITE_OTHER): Payer: Self-pay | Admitting: Internal Medicine

## 2021-01-13 MED ORDER — NIRMATRELVIR/RITONAVIR (PAXLOVID)TABLET
3.0000 | ORAL_TABLET | Freq: Two times a day (BID) | ORAL | 0 refills | Status: DC
  Filled 2021-01-13: qty 30, 5d supply, fill #0

## 2021-01-14 MED ORDER — NIRMATRELVIR/RITONAVIR (PAXLOVID)TABLET: 3.0000 | ORAL_TABLET | Freq: Two times a day (BID) | ORAL | 0 refills | Status: AC

## 2021-01-16 ENCOUNTER — Other Ambulatory Visit (HOSPITAL_BASED_OUTPATIENT_CLINIC_OR_DEPARTMENT_OTHER): Payer: Self-pay

## 2021-01-25 ENCOUNTER — Other Ambulatory Visit: Payer: Self-pay

## 2021-01-25 ENCOUNTER — Ambulatory Visit (INDEPENDENT_AMBULATORY_CARE_PROVIDER_SITE_OTHER): Payer: 59 | Admitting: Internal Medicine

## 2021-01-25 ENCOUNTER — Encounter (INDEPENDENT_AMBULATORY_CARE_PROVIDER_SITE_OTHER): Payer: Self-pay | Admitting: Internal Medicine

## 2021-01-25 VITALS — BP 121/76 | HR 89 | Temp 97.8°F | Resp 18 | Ht 64.0 in | Wt 144.3 lb

## 2021-01-25 DIAGNOSIS — E785 Hyperlipidemia, unspecified: Secondary | ICD-10-CM

## 2021-01-25 DIAGNOSIS — E2839 Other primary ovarian failure: Secondary | ICD-10-CM | POA: Diagnosis not present

## 2021-01-25 DIAGNOSIS — E039 Hypothyroidism, unspecified: Secondary | ICD-10-CM | POA: Diagnosis not present

## 2021-01-25 NOTE — Progress Notes (Signed)
Metrics: Intervention Frequency ACO  Documented Smoking Status Yearly  Screened one or more times in 24 months  Cessation Counseling or  Active cessation medication Past 24 months  Past 24 months   Guideline developer: UpToDate (See UpToDate for funding source) Date Released: 2014       Wellness Office Visit  Subjective:  Patient ID: Kathleen Luna, female    DOB: [birth year]  Age: 55 y.o. MRN: [id number]  CC: This delightful lady comes in for follow-up regarding bioidentical hormone therapy, hypothyroidism, hyperlipidemia. HPI  Unfortunately, she had COVID-19 disease and is finally recovering from this.  In the process, she admits that she was eating more calories just to have an appetite and has gained weight. She has tolerated higher doses of testosterone 10 mg intramuscular twice a week, estradiol 2.5 mg daily, NP thyroid 120 mg in the morning and NP thyroid 60 mg at lunchtime without any real problems. She does have significant hyperlipidemia which we are working on with a combination of nutritional changes as well as reducing visceral fat through the use of hormones. Past Medical History:  Diagnosis Date  . Headache(784.0)    otc med prn  . Hyperlipidemia   . Hypothyroidism   . Hypothyroidism, adult 06/02/2019  . Primary ovarian failure 06/02/2019  . SVD (spontaneous vaginal delivery)    x 1  . Vitamin D deficiency disease 06/02/2019   Past Surgical History:  Procedure Laterality Date  . BREAST SURGERY  09/2010   augmentation  . VULVAR LESION REMOVAL  01/17/2012   Procedure: VULVAR LESION;  Surgeon: Allena Katz, MD;  Location: [facility];  Service: Gynecology;  Laterality: N/A;  incision and drainage  of perineal abscess  . WISDOM TOOTH EXTRACTION       Family History  Problem Relation Age of Onset  . Hyperlipidemia Mother   . Hypertension Mother   . Dementia Mother   . Early death Father   . Heart attack Father   . Cancer Sister   . Heart disease Sister   .  Cancer Brother   . 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   Social History   Tobacco Use  . Smoking status: Current Every Day Smoker    Packs/day: 0.50    Years: 15.00    Pack years: 7.50    Types: Cigarettes    Last attempt to quit: 07/07/1997    Years since quitting: 23.5  . Smokeless tobacco: Never Used  . Tobacco comment: RESTART SMOKING  Substance Use Topics  . Alcohol use: Yes    Comment: weekends    Current Meds  Medication Sig  . Ascorbic Acid (VITAMIN C) 1000 MG tablet Take 1,000 mg by mouth daily.  . Cholecalciferol (VITAMIN D-3) 125 MCG (5000 UT) TABS Take 2 tablets by mouth daily.  Marland Kitchen estradiol (ESTRACE) 0.5 MG tablet Take 1 tablet (0.5 mg total) by mouth daily.  Marland Kitchen estradiol (ESTRACE) 2 MG tablet Take 1 tablet (2 mg total) by mouth daily.  . NP THYROID 120 MG tablet TAKE 1 TABLET (120 MG TOTAL) BY MOUTH DAILY BEFORE BREAKFAST.  . NP THYROID 60 MG tablet Take 1 tablet (60 mg total) by mouth daily before breakfast.  . progesterone (PROMETRIUM) 200 MG capsule TAKE 2 CAPSULES (400 MG TOTAL) BY MOUTH DAILY.  . TESTOSTERONE CYPIONATE IM Inject 10 mg into the muscle 2 (two)  times a week. COMPOUNDED  . [DISCONTINUED] NP THYROID 30 MG tablet TAKE 1 TABLET (30 MG TOTAL) BY MOUTH DAILY BEFORE BREAKFAST.     [place] Office Visit from 11/09/2020 in [facility]  PHQ-9 Total Score 0      Objective:   Today's Vitals: BP 121/76 (BP Location: Right Arm, Patient Position: Sitting, Cuff Size: Normal)   Pulse 89   Temp 97.8 F (36.6 C) (Temporal)   Resp 18   Ht 5\' 4"  (1.626 m)   Wt 144 lb 4.8 oz (65.5 kg)   SpO2 99%   BMI 24.77 kg/m  Vitals with BMI 01/25/2021 11/09/2020 08/03/2020  Height 5\' 4"  5\' 4"  5\' 4"   Weight 144 lbs 5 oz 135 lbs 13 oz 135 lbs 6 oz  BMI 24.76 51.0 25.85  Systolic 277 824 235  Diastolic 76 78  70  Pulse 89 91 90     Physical Exam   She looks systemically well.  She has gained 9 pounds since last visit.  Blood pressure is excellent.    Assessment   1. Primary ovarian failure   2. Hypothyroidism, adult   3. Dyslipidemia       Tests ordered Orders Placed This Encounter  Procedures  . DHEA-sulfate  . Estradiol  . Progesterone  . T3, free  . T4, free  . TSH  . Testos,Total,Free and SHBG (Female)  . Lipid panel     Plan: 1. She will continue with estradiol, progesterone as before and we will check levels. 2. She will continue with testosterone therapy and I will check levels.  Her last injection was approximately 4 days ago. 3. Continue with current dose of NP thyroid and we will check thyroid panel. 4. We will check a lipid panel at her request. 5. I will see her in September for an annual physical exam and further recommendations will depend on blood results   No orders of the defined types were placed in this encounter.   Doree Albee, MD

## 2021-01-25 NOTE — Progress Notes (Signed)
Pt went up in wt & wants to be down.

## 2021-01-28 LAB — TSH: TSH: <0.01 mIU/L — ABNORMAL LOW (ref 0.40–4.50)

## 2021-01-28 LAB — LIPID PANEL
Cholesterol: 334 mg/dL — ABNORMAL HIGH (ref <200)
HDL: 53 mg/dL (ref >=50)
LDL Cholesterol (Calc): 254 mg/dL (calc) — ABNORMAL HIGH
Non-HDL Cholesterol (Calc): 281 mg/dL (calc) — ABNORMAL HIGH (ref <130)
Total CHOL/HDL Ratio: 6.3 (calc) — ABNORMAL HIGH (ref <5.0)
Triglycerides: 123 mg/dL (ref <150)

## 2021-01-28 LAB — DHEA-SULFATE: DHEA-SO4: 132 ug/dL (ref 5–167)

## 2021-01-28 LAB — TESTOS,TOTAL,FREE AND SHBG (FEMALE)
Free Testosterone: 1.3 pg/mL (ref 0.1–6.4)
Sex Hormone Binding: 126 nmol/L — ABNORMAL HIGH (ref 14–73)
Testosterone, Total, LC-MS-MS: 28 ng/dL (ref 2–45)

## 2021-01-28 LAB — ESTRADIOL: Estradiol: 59 pg/mL

## 2021-01-28 LAB — PROGESTERONE: Progesterone: 5.4 ng/mL

## 2021-01-28 LAB — T3, FREE: T3, Free: 5.6 pg/mL — ABNORMAL HIGH (ref 2.3–4.2)

## 2021-01-28 LAB — T4, FREE: Free T4: 1.5 ng/dL (ref 0.8–1.8)

## 2021-01-31 ENCOUNTER — Other Ambulatory Visit (INDEPENDENT_AMBULATORY_CARE_PROVIDER_SITE_OTHER): Payer: Self-pay | Admitting: Internal Medicine

## 2021-01-31 ENCOUNTER — Encounter (INDEPENDENT_AMBULATORY_CARE_PROVIDER_SITE_OTHER): Payer: Self-pay | Admitting: Internal Medicine

## 2021-02-09 ENCOUNTER — Other Ambulatory Visit (HOSPITAL_BASED_OUTPATIENT_CLINIC_OR_DEPARTMENT_OTHER): Payer: Self-pay

## 2021-02-15 ENCOUNTER — Other Ambulatory Visit (HOSPITAL_BASED_OUTPATIENT_CLINIC_OR_DEPARTMENT_OTHER): Payer: Self-pay

## 2021-02-15 ENCOUNTER — Other Ambulatory Visit (INDEPENDENT_AMBULATORY_CARE_PROVIDER_SITE_OTHER): Payer: Self-pay | Admitting: Internal Medicine

## 2021-02-15 MED ORDER — PROGESTERONE 200 MG PO CAPS
ORAL_CAPSULE | ORAL | 3 refills | Status: DC
  Filled 2021-02-15 – 2021-02-16 (×2): qty 60, 30d supply, fill #0
  Filled 2021-03-22: qty 60, 30d supply, fill #1
  Filled 2021-05-04: qty 60, 30d supply, fill #2

## 2021-02-16 ENCOUNTER — Other Ambulatory Visit (HOSPITAL_BASED_OUTPATIENT_CLINIC_OR_DEPARTMENT_OTHER): Payer: Self-pay

## 2021-02-21 DIAGNOSIS — H5203 Hypermetropia, bilateral: Secondary | ICD-10-CM | POA: Diagnosis not present

## 2021-02-21 DIAGNOSIS — H52203 Unspecified astigmatism, bilateral: Secondary | ICD-10-CM | POA: Diagnosis not present

## 2021-03-02 DIAGNOSIS — M79645 Pain in left finger(s): Secondary | ICD-10-CM | POA: Diagnosis not present

## 2021-03-02 DIAGNOSIS — M1812 Unilateral primary osteoarthritis of first carpometacarpal joint, left hand: Secondary | ICD-10-CM | POA: Diagnosis not present

## 2021-03-03 ENCOUNTER — Other Ambulatory Visit (HOSPITAL_BASED_OUTPATIENT_CLINIC_OR_DEPARTMENT_OTHER): Payer: Self-pay

## 2021-03-15 ENCOUNTER — Other Ambulatory Visit (HOSPITAL_BASED_OUTPATIENT_CLINIC_OR_DEPARTMENT_OTHER): Payer: Self-pay

## 2021-03-22 ENCOUNTER — Other Ambulatory Visit (HOSPITAL_BASED_OUTPATIENT_CLINIC_OR_DEPARTMENT_OTHER): Payer: Self-pay

## 2021-03-28 ENCOUNTER — Telehealth (INDEPENDENT_AMBULATORY_CARE_PROVIDER_SITE_OTHER): Payer: Self-pay | Admitting: Internal Medicine

## 2021-03-28 NOTE — Telephone Encounter (Signed)
Patient called, left VM to return the call to [phone] to speak to a nurse about hormonal concerns.

## 2021-03-28 NOTE — Telephone Encounter (Signed)
Pt returned call. States she had decided to reach out to her OB/GYN for guidance regarding hormonal issues.Liana Gerold to find PCP to establish care, pt declined, stating she would use her OB/GYN for now. Advised to CB if needed.

## 2021-03-28 NOTE — Telephone Encounter (Signed)
Pt called in for advise. She is a previous pt of Dr. Lanice Shirts who has recently passed away. Pt has been seeing him for hormonal concerns. Pt says that she had a follow up apt scheduled with him. Pt says that she will be looking for a new provider meanwhile she would like to discuss what could/should she do to keep her levels at a good point.?   Pt would like to discuss and be advised further.

## 2021-03-29 ENCOUNTER — Other Ambulatory Visit (INDEPENDENT_AMBULATORY_CARE_PROVIDER_SITE_OTHER): Payer: Self-pay | Admitting: Internal Medicine

## 2021-03-29 ENCOUNTER — Other Ambulatory Visit (HOSPITAL_BASED_OUTPATIENT_CLINIC_OR_DEPARTMENT_OTHER): Payer: Self-pay

## 2021-03-29 MED ORDER — ESTRADIOL 0.5 MG PO TABS
0.5000 mg | ORAL_TABLET | Freq: Every day | ORAL | 0 refills | Status: DC
  Filled 2021-03-29: qty 30, 30d supply, fill #0

## 2021-04-11 ENCOUNTER — Other Ambulatory Visit (HOSPITAL_BASED_OUTPATIENT_CLINIC_OR_DEPARTMENT_OTHER): Payer: Self-pay

## 2021-04-12 ENCOUNTER — Emergency Department (HOSPITAL_BASED_OUTPATIENT_CLINIC_OR_DEPARTMENT_OTHER)
Admission: EM | Admit: 2021-04-12 | Discharge: 2021-04-12 | Disposition: A | Discharge status: Home / Self Care | Payer: 59 | Attending: Emergency Medicine | Admitting: Emergency Medicine

## 2021-04-12 ENCOUNTER — Emergency Department (HOSPITAL_BASED_OUTPATIENT_CLINIC_OR_DEPARTMENT_OTHER): Payer: 59

## 2021-04-12 ENCOUNTER — Other Ambulatory Visit: Payer: Self-pay

## 2021-04-12 ENCOUNTER — Encounter (HOSPITAL_BASED_OUTPATIENT_CLINIC_OR_DEPARTMENT_OTHER): Payer: Self-pay

## 2021-04-12 ENCOUNTER — Emergency Department (HOSPITAL_BASED_OUTPATIENT_CLINIC_OR_DEPARTMENT_OTHER): Payer: 59 | Admitting: Radiology

## 2021-04-12 ENCOUNTER — Other Ambulatory Visit (HOSPITAL_BASED_OUTPATIENT_CLINIC_OR_DEPARTMENT_OTHER): Payer: Self-pay

## 2021-04-12 DIAGNOSIS — R079 Chest pain, unspecified: Secondary | ICD-10-CM

## 2021-04-12 DIAGNOSIS — R0789 Other chest pain: Secondary | ICD-10-CM | POA: Insufficient documentation

## 2021-04-12 DIAGNOSIS — R000 Tachycardia, unspecified: Secondary | ICD-10-CM | POA: Insufficient documentation

## 2021-04-12 DIAGNOSIS — J9811 Atelectasis: Secondary | ICD-10-CM | POA: Diagnosis not present

## 2021-04-12 DIAGNOSIS — E039 Hypothyroidism, unspecified: Secondary | ICD-10-CM | POA: Diagnosis not present

## 2021-04-12 DIAGNOSIS — F17210 Nicotine dependence, cigarettes, uncomplicated: Secondary | ICD-10-CM | POA: Insufficient documentation

## 2021-04-12 DIAGNOSIS — J8410 Pulmonary fibrosis, unspecified: Secondary | ICD-10-CM | POA: Diagnosis not present

## 2021-04-12 DIAGNOSIS — R0602 Shortness of breath: Secondary | ICD-10-CM | POA: Insufficient documentation

## 2021-04-12 LAB — BASIC METABOLIC PANEL
Anion gap: 9 (ref 5–15)
BUN: 12 mg/dL (ref 6–20)
CO2: 25 mmol/L (ref 22–32)
Calcium: 9.8 mg/dL (ref 8.9–10.3)
Chloride: 104 mmol/L (ref 98–111)
Creatinine, Ser: 0.44 mg/dL (ref 0.44–1.00)
GFR, Estimated: >60 mL/min (ref >60)
Glucose, Bld: 97 mg/dL (ref 70–99)
Potassium: 3.6 mmol/L (ref 3.5–5.1)
Sodium: 138 mmol/L (ref 135–145)

## 2021-04-12 LAB — CBC
HCT: 40.2 % (ref 36.0–46.0)
Hemoglobin: 13.5 g/dL (ref 12.0–15.0)
MCH: 29.1 pg (ref 26.0–34.0)
MCHC: 33.6 g/dL (ref 30.0–36.0)
MCV: 86.6 fL (ref 80.0–100.0)
Platelets: 309 10*3/uL (ref 150–400)
RBC: 4.64 MIL/uL (ref 3.87–5.11)
RDW: 13.4 % (ref 11.5–15.5)
WBC: 14 10*3/uL — ABNORMAL HIGH (ref 4.0–10.5)
nRBC: 0 % (ref 0.0–0.2)

## 2021-04-12 LAB — TROPONIN I (HIGH SENSITIVITY)
Troponin I (High Sensitivity): 3 ng/L (ref <18)
Troponin I (High Sensitivity): 3 ng/L (ref <18)

## 2021-04-12 MED ORDER — NITROGLYCERIN 0.4 MG SL SUBL
0.4000 mg | SUBLINGUAL_TABLET | SUBLINGUAL | Status: DC | PRN
  Administered 2021-04-12 (×3): 0.4 mg via SUBLINGUAL
  Filled 2021-04-12 (×2): qty 1

## 2021-04-12 MED ORDER — ONDANSETRON HCL 4 MG/2ML IJ SOLN
4.0000 mg | Freq: Once | INTRAMUSCULAR | Status: AC
  Administered 2021-04-12: 4 mg via INTRAVENOUS
  Filled 2021-04-12: qty 2

## 2021-04-12 MED ORDER — PANTOPRAZOLE SODIUM 40 MG IV SOLR
40.0000 mg | Freq: Once | INTRAVENOUS | Status: AC
  Administered 2021-04-12: 40 mg via INTRAVENOUS
  Filled 2021-04-12: qty 40

## 2021-04-12 MED ORDER — LIDOCAINE VISCOUS HCL 2 % MT SOLN
15.0000 mL | Freq: Once | OROMUCOSAL | Status: AC
  Administered 2021-04-12: 15 mL via ORAL
  Filled 2021-04-12: qty 15

## 2021-04-12 MED ORDER — ALUM & MAG HYDROXIDE-SIMETH 200-200-20 MG/5ML PO SUSP
30.0000 mL | Freq: Once | ORAL | Status: AC
  Administered 2021-04-12: 30 mL via ORAL
  Filled 2021-04-12: qty 30

## 2021-04-12 MED ORDER — ASPIRIN 81 MG PO CHEW
324.0000 mg | CHEWABLE_TABLET | Freq: Once | ORAL | Status: AC
  Administered 2021-04-12: 324 mg via ORAL
  Filled 2021-04-12: qty 4

## 2021-04-12 MED ORDER — IOHEXOL 350 MG/ML SOLN
75.0000 mL | Freq: Once | INTRAVENOUS | Status: AC | PRN
  Administered 2021-04-12: 60 mL via INTRAVENOUS

## 2021-04-12 MED ORDER — KETOROLAC TROMETHAMINE 30 MG/ML IJ SOLN
30.0000 mg | Freq: Once | INTRAMUSCULAR | Status: AC
  Administered 2021-04-12: 30 mg via INTRAVENOUS
  Filled 2021-04-12: qty 1

## 2021-04-12 MED ORDER — NAPROXEN 500 MG PO TABS
500.0000 mg | ORAL_TABLET | Freq: Two times a day (BID) | ORAL | 0 refills | Status: DC
  Filled 2021-04-12: qty 30, 15d supply, fill #0

## 2021-04-12 NOTE — ED Provider Notes (Signed)
[facility] Provider Note  CSN: [id number] Arrival date & time: 04/12/21 [id number]    History Chief Complaint  Patient presents with   Chest Pain    Kathleen Luna is a 56 y.o. female with history of hypothyroidism and HLD reports she was woken up from sleep this morning with midsternal chest pain and SOB, pain is worse with deep breath. No fevers. Pain radiates into her jaw. She has not had recent travel, denies leg swelling. No nausea, vomiting or diaphoresis. She is on estrogen replacement and she smokes.    Past Medical History:  Diagnosis Date   Headache(784.0)    otc med prn   Hyperlipidemia    Hypothyroidism    Hypothyroidism, adult 06/02/2019   Primary ovarian failure 06/02/2019   SVD (spontaneous vaginal delivery)    x 1   Vitamin D deficiency disease 06/02/2019    Past Surgical History:  Procedure Laterality Date   BREAST SURGERY  09/2010   augmentation   VULVAR LESION REMOVAL  01/17/2012   Procedure: VULVAR LESION;  Surgeon: Allena Katz, MD;  Location: [facility];  Service: Gynecology;  Laterality: N/A;  incision and drainage  of perineal abscess   WISDOM TOOTH EXTRACTION      Family History  Problem Relation Age of Onset   Hyperlipidemia Mother    Hypertension Mother    Dementia Mother    Early death Father    Heart attack Father    Cancer Sister    Heart disease Sister    Cancer Brother    Hyperlipidemia Daughter     Social History   Tobacco Use   Smoking status: Every Day    Packs/day: 0.50    Years: 15.00    Pack years: 7.50    Types: Cigarettes    Last attempt to quit: 07/07/1997    Years since quitting: 23.7   Smokeless tobacco: Never   Tobacco comments:    RESTART SMOKING  Vaping Use   Vaping Use: Former  Substance Use Topics   Alcohol use: Yes    Comment: weekends   Drug use: No     Home Medications Prior to Admission medications   Medication Sig Start Date End Date Taking? Authorizing Provider   Ascorbic Acid (VITAMIN C) 1000 MG tablet Take 1,000 mg by mouth daily.   Yes [provider]  Cholecalciferol (VITAMIN D-3) 125 MCG (5000 UT) TABS Take 2 tablets by mouth daily.   Yes [provider]  estradiol (ESTRACE) 0.5 MG tablet Take 1 tablet (0.5 mg total) by mouth daily. 03/29/21  Yes Ailene Ards, NP  estradiol (ESTRACE) 2 MG tablet Take 1 tablet (2 mg total) by mouth daily. 11/29/20  Yes Gosrani, Nimish C, MD  naproxen (NAPROSYN) 500 MG tablet Take 1 tablet (500 mg total) by mouth 2 (two) times daily. 04/12/21  Yes Truddie Hidden, MD  NP THYROID 120 MG tablet TAKE 1 TABLET (120 MG TOTAL) BY MOUTH DAILY BEFORE BREAKFAST. 11/28/20 11/28/21 Yes Doree Albee, MD  NP THYROID 60 MG tablet Take 1 tablet (60 mg total) by mouth daily before breakfast. 11/09/20  Yes Gosrani, Nimish C, MD  progesterone (PROMETRIUM) 200 MG capsule TAKE 2 CAPSULES (400 MG TOTAL) BY MOUTH DAILY. 02/15/21 02/15/22 Yes Gosrani, Nimish C, MD  TESTOSTERONE CYPIONATE IM Inject 10 mg into the muscle 2 (two) times a week. COMPOUNDED   Yes [provider]     Allergies    Hydrocodone  Review of Systems   Review of Systems A comprehensive review of systems was completed and negative except as noted in HPI.    Physical Exam BP 129/83   Pulse 99   Temp 98.2 F (36.8 C) (Oral)   Resp 20   SpO2 97%   Physical Exam Vitals and nursing note reviewed.  Constitutional:      Appearance: Normal appearance.  HENT:     Head: Normocephalic and atraumatic.     Nose: Nose normal.     Mouth/Throat:     Mouth: Mucous membranes are moist.  Eyes:     Extraocular Movements: Extraocular movements intact.     Conjunctiva/sclera: Conjunctivae normal.  Cardiovascular:     Rate and Rhythm: Tachycardia present.  Pulmonary:     Effort: Pulmonary effort is normal.     Breath sounds: Normal breath sounds.  Abdominal:     General: Abdomen is flat.     Palpations: Abdomen is soft.     Tenderness:  There is no abdominal tenderness. There is no guarding.  Musculoskeletal:        General: No swelling. Normal range of motion.     Cervical back: Neck supple.     Right lower leg: No edema.     Left lower leg: No edema.  Skin:    General: Skin is warm and dry.  Neurological:     General: No focal deficit present.     Mental Status: She is alert.  Psychiatric:        Mood and Affect: Mood normal.     ED Results / Procedures / Treatments   Labs (all labs ordered are listed, but only abnormal results are displayed) Labs Reviewed  CBC - Abnormal; Notable for the following components:      Result Value   WBC 14.0 (*)    All other components within normal limits  BASIC METABOLIC PANEL  TROPONIN I (HIGH SENSITIVITY)  TROPONIN I (HIGH SENSITIVITY)    EKG EKG Interpretation  Date/Time:  Wednesday April 12 2021 07:35:25 EDT Ventricular Rate:  109 PR Interval:  149 QRS Duration: 81 QT Interval:  319 QTC Calculation: 430 R Axis:   53 Text Interpretation: Sinus tachycardia Anterior infarct, old Since last tracing Rate faster Confirmed by Calvert Cantor [phone]) on 04/12/2021 7:42:46 AM   Radiology DG Chest 2 View  Result Date: 04/12/2021 CLINICAL DATA:  Chest pain EXAM: CHEST - 2 VIEW COMPARISON:  None. FINDINGS: The cardiomediastinal silhouette is normal. Scattered nodular densities throughout both lungs likely reflect calcified granulomas, as seen in the lung apices on the cervical spine CT of 05/21/2018. Otherwise, there is no focal consolidation or pulmonary edema. There is no pleural effusion or pneumothorax. There is no acute osseous abnormality. IMPRESSION: 1. No radiographic evidence of acute cardiopulmonary process. 2. Scattered calcified granulomas throughout both lungs suggest sequela of prior granulomatous disease. Electronically Signed   By: Valetta Mole M.D.   On: 04/12/2021 08:18   CT Angio Chest PE W/Cm &/Or Wo Cm  Result Date: 04/12/2021 CLINICAL DATA:  PE  suspected, high prob chest pain, SOB, on estrogen EXAM: CT ANGIOGRAPHY CHEST WITH CONTRAST TECHNIQUE: Multidetector CT imaging of the chest was performed using the standard protocol during bolus administration of intravenous contrast. Multiplanar CT image reconstructions and MIPs were obtained to evaluate the vascular anatomy. CONTRAST:  67m OMNIPAQUE IOHEXOL 350 MG/ML SOLN COMPARISON:  None. FINDINGS: Cardiovascular: Satisfactory opacification of the pulmonary arteries to the segmental level. No evidence of pulmonary  embolism. The thoracic aorta is normal in caliber. Normal heart size. No pericardial effusion. Mediastinum/Nodes: No enlarged mediastinal, hilar, or axillary lymph nodes. The thyroid gland appears normal. Lungs/Pleura: No pleural effusion. No pneumothorax. Bilateral subsegmental atelectasis in the dependent lung bases. Scattered bilateral calcified granulomas. Musculoskeletal: No aggressive osseous lesions. Bilateral breast implants. Upper abdomen: There is a small fat containing diaphragmatic hernia on the left. Review of the MIP images confirms the above findings. IMPRESSION: 1. No pulmonary embolism. 2. Multiple calcified nodules would be consistent with history of previous granulomatous disease. 3. Subsegmental atelectasis in the dependent lung bases. 4. Incidental note is made of a small, fat-containing diaphragmatic hernia on the left. Electronically Signed   By: Albin Felling M.D.   On: 04/12/2021 10:11    Procedures Procedures  Medications Ordered in the ED Medications  nitroGLYCERIN (NITROSTAT) SL tablet 0.4 mg (0.4 mg Sublingual Given 04/12/21 0824)  aspirin chewable tablet 324 mg (324 mg Oral Given 04/12/21 0813)  ketorolac (TORADOL) 30 MG/ML injection 30 mg (30 mg Intravenous Given 04/12/21 0836)  ondansetron (ZOFRAN) injection 4 mg (4 mg Intravenous Given 04/12/21 0837)  iohexol (OMNIPAQUE) 350 MG/ML injection 75 mL (60 mLs Intravenous Contrast Given 04/12/21 0935)  alum & mag  hydroxide-simeth (MAALOX/MYLANTA) 200-200-20 MG/5ML suspension 30 mL (30 mLs Oral Given 04/12/21 1120)    And  lidocaine (XYLOCAINE) 2 % viscous mouth solution 15 mL (15 mLs Oral Given 04/12/21 1120)  pantoprazole (PROTONIX) injection 40 mg (40 mg Intravenous Given 04/12/21 1120)     MDM Rules/Calculators/A&P MDM Patient with midsternal chest pains, SOB and tachycardia. She is on exogenous estrogen and she smokes. Will check labs, including Trop and send for CTA to rule out PE given she is high risk.   ED Course  I have reviewed the triage vital signs and the nursing notes.  Pertinent labs & imaging results that were available during my care of the patient were reviewed by me and considered in my medical decision making (see chart for details).  Clinical Course as of 04/12/21 1227  Wed Apr 12, 2021  0822 CXR without acute process. CTA pending.  [CS]  [id number] CBC with mild leukocytosis, BMP and Trop #1 is normal.  [CS]  1019 CTA is neg for PE. She has calcified granulomas which are chronic in nature.  [CS]  [id number] Trop #2 remains normal.  [CS]  [id number] Patient reports pain improved with Toradol but then came back. Will try GI cocktail and reassess.  [CS]  [id number] Patient still having some pain, worse with deep breath but not reproducible. I discussed admission for further evaluation but she is adamant that she wants to go home. She will be prescribed NSAIDs for pleuritic type pain. Recommend outpatient follow up with PCP and/or Cardiology. RTED for any other concerns.  [CS]    Clinical Course User Index [CS] Truddie Hidden, MD    Final Clinical Impression(s) / ED Diagnoses Final diagnoses:  Chest pain, unspecified type    Rx / DC Orders ED Discharge Orders          Ordered    naproxen (NAPROSYN) 500 MG tablet  2 times daily        04/12/21 1227             Truddie Hidden, MD 04/12/21 1227

## 2021-04-12 NOTE — ED Triage Notes (Signed)
She c/o central area chest pain with a sensation of shortness of breath which began abruptly at ~0430 today and persists.

## 2021-04-12 NOTE — ED Notes (Signed)
She c/o sudden nausea and sits up to side of bed, at which time she feels "faint". She becomes pale and I immediately lie her down. She remains normotensive and in no distress. She c/o "my chest pain stayed the same, but now I also have a headache".

## 2021-04-12 NOTE — ED Notes (Signed)
To CT at this time.

## 2021-04-12 NOTE — ED Triage Notes (Signed)
Her EKG is being performed as I write this.

## 2021-04-17 NOTE — Progress Notes (Signed)
Cardiology Office Note:    Date:  04/18/2021   ID:  ILLA CAVINDER, DOB 1966-07-12, MRN [id number]  PCP:  Doree Albee, MD (Inactive)   [facility] Providers Cardiologist:  Peter Martinique, MD      Referring MD: No ref. provider found   Presents to clinic for an evaluation of her chest pressure  History of Present Illness:    Kathleen Luna is a 55 y.o. female with a hx of abnormal EKG with normal stress test, essential hypertension, and tobacco abuse.  She was seen by Dr. Martinique on 06/09/2014 for evaluation after she had an abnormal EKG and left arm pain.  Her EKG suggested old septal infarct.  Her risk factors included hypertension and tobacco abuse as well as family history for coronary artery disease.  Her left arm pain resolved.  She underwent stress testing on 05/19/2014.  Her nuclear stress test showed low risk with mild breast attenuation artifact.  She was not noted to have ST or T wave changes.  She presents the clinic today for evaluation of chest pressure.  She states she feels well.  She has had no further episodes of chest discomfort.  We reviewed her lab work from the emergency department and her chest CT.  She expressed understanding.  She does report occasional brief episodes of palpitations.  We discussed avoiding triggers for palpitations.  We reviewed her lipid panel and she reports that she has been off statin therapy for about 6 months.  She did note myalgias with the medication.  I will refer her to the lipid clinic, give her a heart healthy high-fiber diet but she, have her increase her physical activity as tolerated and follow-up with Dr. Martinique in 4-6 months.  Today she denies chest pain, shortness of breath, lower extremity edema, fatigue, palpitations, melena, hematuria, hemoptysis, diaphoresis, weakness, presyncope, and syncope.   Past Medical History:  Diagnosis Date   Headache(784.0)    otc med prn   Hyperlipidemia    Hypothyroidism     Hypothyroidism, adult 06/02/2019   Primary ovarian failure 06/02/2019   SVD (spontaneous vaginal delivery)    x 1   Vitamin D deficiency disease 06/02/2019    Past Surgical History:  Procedure Laterality Date   BREAST SURGERY  09/2010   augmentation   VULVAR LESION REMOVAL  01/17/2012   Procedure: VULVAR LESION;  Surgeon: Allena Katz, MD;  Location: [facility];  Service: Gynecology;  Laterality: N/A;  incision and drainage  of perineal abscess   WISDOM TOOTH EXTRACTION      Current Medications: Current Meds  Medication Sig   Ascorbic Acid (VITAMIN C) 1000 MG tablet Take 1,000 mg by mouth daily.   Cholecalciferol (VITAMIN D-3) 125 MCG (5000 UT) TABS Take 2 tablets by mouth daily.   estradiol (ESTRACE) 0.5 MG tablet Take 1 tablet (0.5 mg total) by mouth daily.   estradiol (ESTRACE) 2 MG tablet Take 1 tablet (2 mg total) by mouth daily.   naproxen (NAPROSYN) 500 MG tablet Take 1 tablet (500 mg total) by mouth 2 (two) times daily.   NP THYROID 120 MG tablet TAKE 1 TABLET (120 MG TOTAL) BY MOUTH DAILY BEFORE BREAKFAST.   NP THYROID 60 MG tablet Take 1 tablet (60 mg total) by mouth daily before breakfast.   progesterone (PROMETRIUM) 200 MG capsule TAKE 2 CAPSULES (400 MG TOTAL) BY MOUTH DAILY.   TESTOSTERONE CYPIONATE IM Inject 10 mg into the muscle 2 (two) times a week.  COMPOUNDED     Allergies:   Hydrocodone   Social History   Socioeconomic History   Marital status: Luna    Spouse name: Not on file   Number of children: Not on file   Years of education: Not on file   Highest education level: Not on file  Occupational History   Not on file  Tobacco Use   Smoking status: Every Day    Packs/day: 0.50    Years: 15.00    Pack years: 7.50    Types: Cigarettes    Last attempt to quit: 07/07/1997    Years since quitting: 23.7   Smokeless tobacco: Never   Tobacco comments:    RESTART SMOKING  Vaping Use   Vaping Use: Former  Substance and Sexual Activity   Alcohol use: Yes     Comment: weekends   Drug use: No   Sexual activity: Yes    Birth control/protection: I.U.D.  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   Social Determinants of Health   Financial Resource Strain: Not on file  Food Insecurity: Not on file  Transportation Needs: Not on file  Physical Activity: Not on file  Stress: Not on file  Social Connections: Not on file     Family History: The patient's family history includes Cancer in her brother and sister; Dementia in her mother; Early death in her father; Heart attack in her father; Heart disease in her sister; Hyperlipidemia in her daughter and mother; Hypertension in her mother.  ROS:   Please see the history of present illness.     All other systems reviewed and are negative.   Risk Assessment/Calculations:           Physical Exam:    VS:  BP 128/90 (BP Location: Right Arm, Patient Position: Sitting, Cuff Size: Normal)   Pulse 74   Ht '5\' 4"'$  (1.626 m)   Wt 145 lb (65.8 kg)   SpO2 98%   BMI 24.89 kg/m     Wt Readings from Last 3 Encounters:  04/18/21 145 lb (65.8 kg)  01/25/21 144 lb 4.8 oz (65.5 kg)  11/09/20 135 lb 12.8 oz (61.6 kg)     GEN:  Well nourished, well developed in no acute distress HEENT: Normal NECK: No JVD; No carotid bruits LYMPHATICS: No lymphadenopathy CARDIAC: RRR, no murmurs, rubs, gallops RESPIRATORY:  Clear to auscultation without rales, wheezing or rhonchi  ABDOMEN: Soft, non-tender, non-distended MUSCULOSKELETAL:  No edema; No deformity  SKIN: Warm and dry NEUROLOGIC:  Alert and oriented x 3 PSYCHIATRIC:  Normal affect    EKGs/Labs/Other Studies Reviewed:    The following studies were reviewed today:  Nuclear stress test 05/19/2014 Indication for Stress Test: Evaluation for Ischemia and Abnormal EKG  History: no prior nuclear MPI  Cardiac Risk Factors:  Family History - CAD, Hypertension, Lipids and Smoker  Symptoms: SOB and Left arm pain  Nuclear Pre-Procedure  Caffeine/Decaff Intake: 7:00pm  NPO After: 5:00am   IV Site: R Antecubital  IV 0.9% NS with Angio Cath: 22g   Chest Size (in): n/a  IV Started by: Otho Perl, CNMT   Height: '5\' 4"'$  (1.626 m)  Cup Size: 36D Bil Implants   BMI: Body mass index is 23.68 kg/(m^2).  Weight: 138 lb (62.596 kg)     Tech Comments: Bilateral breast implants   Nuclear Med Study  1 or 2  day study: 1 day  Stress Test Type: Stress   Order Authorizing Provider: Peter Martinique, MD     Resting Radionuclide: Technetium 46mSestamibi  Resting Radionuclide Dose: 10.3 mCi   Stress Radionuclide: Technetium 951mestamibi  Stress Radionuclide Dose: 29.8 mCi   Stress Protocol  Rest HR: 74  Stress HR:148   Rest BP: 150/99  Stress BP: 177/98   Exercise Time (min): 10:02  METS: 10.80   Predicted Max HR: 170 bpm  % Max HR: 87.06 bpm  Rate Pressure Product: 26196  Dose of Adenosine (mg): n/a  Dose of Lexiscan: n/a mg   Dose of Atropine (mg): n/a  Dose of Dobutamine: n/a mcg/kg/min (at max HR)   Stress Test Technologist: GwMellody MemosCCT  Nuclear Technologist: PaImagene RichesCNMT   Rest Procedure: Myocardial perfusion imaging was performed at rest 45 minutes following the intravenous administration of Technetium 9927mstamibi.  Stress Procedure: The patient performed treadmill exercise using a Bruce Protocol for 10 minutes 2 seconds. The patient stopped due to extreme head pain and denied any chest pain. There were no significant ST-T wave changes. Technetium 77m76mtamibi was injected IV at peak exercise and myocardial perfusion imaging was performed after a brief delay.  Transient Ischemic Dilatation (Normal <1.22): 1.01  QGS EDV: 85 ml  QGS ESV: 30 ml  LV Ejection Fraction: 64%  Rest ECG: NSR - Normal EKG  Stress ECG: No significant change from baseline ECG  QPS  Raw Data Images: There is a breast shadow that  accounts for the anterior attenuation.  Stress Images: Normal homogeneous uptake in all areas of the myocardium.  Rest Images: Comparison with the stress images reveals no significant change.  Subtraction (SDS): No evidence of ischemia.  LV Wall Motion: NL LV Function; NL Wall Motion  Impression  Exercise Capacity: Good exercise capacity.  BP Response: Hypertensive blood pressure response.  Clinical Symptoms: No significant symptoms noted.  ECG Impression: No significant ST segment change suggestive of ischemia.  Comparison with Prior Nuclear Study: No previous nuclear study performed  Overall Impression: Low risk stress nuclear study with mild breast attenuation artifact..  CSanda Klein  05/19/2014 10:45 AM   EKG:  EKG is  ordered today.  The ekg ordered today demonstrates normal sinus rhythm septal infarct undetermined age 6 b64  Recent Labs: 01/25/2021: TSH <0.01 04/12/2021: BUN 12; Creatinine, Ser 0.44; Hemoglobin 13.5; Platelets 309; Potassium 3.6; Sodium 138  Recent Lipid Panel    Component Value Date/Time   CHOL 334 (H) 01/25/2021 1330   TRIG 123 01/25/2021 1330   HDL 53 01/25/2021 1330   CHOLHDL 6.3 (H) 01/25/2021 1330   LDLCALC 254 (H) 01/25/2021 1330    ASSESSMENT & PLAN    Chest pressure-denies chest pressure or arm neck or back discomfort today.  EKG today shows normal sinus rhythm septal infarct undetermined age 6 b12.  Presented to the emergency department 04/12/2021 troponins were 3 and 3.  EKG showed sinus tachycardia, old anterior infarct, 109 bpm.  Previous stress test showed low risk.  Details above. Heart healthy low-sodium diet-salty 6 given Increase physical activity as tolerated  Hyperlipidemia-01/25/2021: Cholesterol 334; HDL 53; LDL Cholesterol (Calc) 254; Triglycerides 123. Does not want to go back on statin.  Recommend PCSK9 Heart healthy low-sodium high-fiber diet Increase physical activity as tolerated Refer to lipid clinic  Essential  hypertension-BP today 128/90.  Well-controlled at home. Continue to monitor, blood pressure log Heart healthy low-sodium diet-salty 6 given Increase physical activity as tolerated  Disposition: Follow-up with Dr. Martinique in 4-6 months.       Medication Adjustments/Labs and Tests Ordered: Current medicines are reviewed at length with the patient today.  Concerns regarding medicines are outlined above.  No orders of the defined types were placed in this encounter.  No orders of the defined types were placed in this encounter.   There are no Patient Instructions on file for this visit.   Signed, Deberah Pelton, NP  04/18/2021 9:26 AM      Notice: This dictation was prepared with Dragon dictation along with smaller phrase technology. Any transcriptional errors that result from this process are unintentional and may not be corrected upon review.  I spent 13 minutes examining this patient, reviewing medications, and using patient centered shared decision making involving her cardiac care.  Prior to her visit I spent greater than 20 minutes reviewing her past medical history,  medications, and prior cardiac tests.

## 2021-04-18 ENCOUNTER — Encounter (HOSPITAL_BASED_OUTPATIENT_CLINIC_OR_DEPARTMENT_OTHER): Payer: Self-pay | Admitting: General Practice

## 2021-04-18 ENCOUNTER — Other Ambulatory Visit: Payer: Self-pay

## 2021-04-18 ENCOUNTER — Ambulatory Visit (INDEPENDENT_AMBULATORY_CARE_PROVIDER_SITE_OTHER): Payer: 59 | Admitting: General Practice

## 2021-04-18 VITALS — BP 128/90 | HR 74 | Ht 64.0 in | Wt 145.0 lb

## 2021-04-18 DIAGNOSIS — R0789 Other chest pain: Secondary | ICD-10-CM | POA: Diagnosis not present

## 2021-04-18 DIAGNOSIS — Z720 Tobacco use: Secondary | ICD-10-CM | POA: Diagnosis not present

## 2021-04-18 DIAGNOSIS — I10 Essential (primary) hypertension: Secondary | ICD-10-CM | POA: Diagnosis not present

## 2021-04-18 DIAGNOSIS — E782 Mixed hyperlipidemia: Secondary | ICD-10-CM | POA: Diagnosis not present

## 2021-04-18 NOTE — Patient Instructions (Addendum)
Medication Instructions:  Your physician recommends that you continue on your current medications as directed. Please refer to the Current Medication list given to you today.  *If you need a refill on your cardiac medications before your next appointment, please call your pharmacy*  Follow-Up: At [facility], you and your health needs are our priority.  As part of our continuing mission to provide you with exceptional heart care, we have created designated Provider Care Teams.  These Care Teams include your primary Cardiologist (physician) and Advanced Practice Providers (APPs -  Physician Assistants and Nurse Practitioners) who all work together to provide you with the care you need, when you need it.  We recommend signing up for the patient portal called "MyChart".  Sign up information is provided on this After Visit Summary.  MyChart is used to connect with patients for Virtual Visits (Telemedicine).  Patients are able to view lab/test results, encounter notes, upcoming appointments, etc.  Non-urgent messages can be sent to your provider as well.   To learn more about what you can do with MyChart, go to [email or web address].    Your next appointment:   4-6 month(s)  The format for your next appointment:   In Person  Provider:   You may see Peter Martinique, MD or one of the following Advanced Practice Providers on your designated Care Team:   Almyra Deforest, PA-C Fabian Sharp, [place] or  Roby Lofts, [place]   Other Instructions You have been referred to the [facility] with our Pharmacists at [place].  Palpitations Palpitations are feelings that your heartbeat is irregular or is faster than normal. It may feel like your heart is fluttering or skipping a beat. Palpitations are usually not a serious problem. They may be caused by many things, including smoking, caffeine, alcohol, stress, and certain medicines or drugs. Most causes of palpitations are not serious. However,  some palpitations can be a sign of a serious problem. You may need further tests to rule outserious medical problems. Follow these instructions at home:     Pay attention to any changes in your condition. Take these actions to helpmanage your symptoms: Eating and drinking Avoid foods and drinks that may cause palpitations. These may include: Caffeinated coffee, tea, soft drinks, diet pills, and energy drinks. Chocolate. Alcohol. Lifestyle Take steps to reduce your stress and anxiety. Things that can help you relax include: Yoga. Mind-body activities, such as deep breathing, meditation, or using words and images to create positive thoughts (guided imagery). Physical activity, such as swimming, jogging, or walking. Tell your health care provider if your palpitations increase with activity. If you have chest pain or shortness of breath with activity, do not continue the activity until you are seen by your health care provider. Biofeedback. This is a method that helps you learn to use your mind to control things in your body, such as your heartbeat. Do not use drugs, including cocaine or ecstasy. Do not use marijuana. Get plenty of rest and sleep. Keep a regular bed time. General instructions Take over-the-counter and prescription medicines only as told by your health care provider. Do not use any products that contain nicotine or tobacco, such as cigarettes and e-cigarettes. If you need help quitting, ask your health care provider. Keep all follow-up visits as told by your health care provider. This is important. These may include visits for further testing if palpitations do not go away or get worse. Contact a health care provider if you: Continue to have a  fast or irregular heartbeat after 24 hours. Notice that your palpitations occur more often. Get help right away if you: Have chest pain or shortness of breath. Have a severe headache. Feel dizzy or you faint. Summary Palpitations are  feelings that your heartbeat is irregular or is faster than normal. It may feel like your heart is fluttering or skipping a beat. Palpitations may be caused by many things, including smoking, caffeine, alcohol, stress, certain medicines, and drugs. Although most causes of palpitations are not serious, some causes can be a sign of a serious medical problem. Get help right away if you faint or have chest pain, shortness of breath, a severe headache, or dizziness. This information is not intended to replace advice given to you by your health care provider. Make sure you discuss any questions you have with your healthcare provider. Document Revised: 09/25/2017 Document Reviewed: 09/25/2017 Elsevier Patient Education  2022 [employer].     High-Fiber Eating Plan Fiber, also called dietary fiber, is a type of carbohydrate. It is found foods such as fruits, vegetables, whole grains, and beans. A high-fiber diet can have many health benefits. Your health care provider may recommend a high-fiber diet to help: Prevent constipation. Fiber can make your bowel movements more regular. Lower your cholesterol. Relieve the following conditions: Inflammation of veins in the anus (hemorrhoids). Inflammation of specific areas of the digestive tract (uncomplicated diverticulosis). A problem of the large intestine, also called the colon, that sometimes causes pain and diarrhea (irritable bowel syndrome, or IBS). Prevent overeating as part of a weight-loss plan. Prevent heart disease, type 2 diabetes, and certain cancers. What are tips for following this plan? Reading food labels  Check the nutrition facts label on food products for the amount of dietary fiber. Choose foods that have 5 grams of fiber or more per serving. The goals for recommended daily fiber intake include: Men (age 31 or younger): 34-38 g. Men (over age 16): 28-34 g. Women (age 14 or younger): 25-28 g. Women (over age 58): 22-25 g. Your  daily fiber goal is _____________ g. Shopping Choose whole fruits and vegetables instead of processed forms, such as apple juice or applesauce. Choose a wide variety of high-fiber foods such as avocados, lentils, oats, and kidney beans. Read the nutrition facts label of the foods you choose. Be aware of foods with added fiber. These foods often have high sugar and sodium amounts per serving. Cooking Use whole-grain flour for baking and cooking. Cook with brown rice instead of white rice. Meal planning Start the day with a breakfast that is high in fiber, such as a cereal that contains 5 g of fiber or more per serving. Eat breads and cereals that are made with whole-grain flour instead of refined flour or white flour. Eat brown rice, bulgur wheat, or millet instead of white rice. Use beans in place of meat in soups, salads, and pasta dishes. Be sure that half of the grains you eat each day are whole grains. General information You can get the recommended daily intake of dietary fiber by: Eating a variety of fruits, vegetables, grains, nuts, and beans. Taking a fiber supplement if you are not able to take in enough fiber in your diet. It is better to get fiber through food than from a supplement. Gradually increase how much fiber you consume. If you increase your intake of dietary fiber too quickly, you may have bloating, cramping, or gas. Drink plenty of water to help you digest fiber. Choose high-fiber  snacks, such as berries, raw vegetables, nuts, and popcorn. What foods should I eat? Fruits Berries. Pears. Apples. Oranges. Avocado. Prunes and raisins. Dried figs. Vegetables Sweet potatoes. Spinach. Kale. Artichokes. Cabbage. Broccoli. Cauliflower.Green peas. Carrots. Squash. Grains Whole-grain breads. Multigrain cereal. Oats and oatmeal. Brown rice. Barley.Bulgur wheat. [place]. Quinoa. Bran muffins. Popcorn. Rye wafer crackers. Meats and other proteins Navy beans, kidney beans, and  pinto beans. Soybeans. Split peas. Lentils. Nutsand seeds. Dairy Fiber-fortified yogurt. Beverages Fiber-fortified soy milk. Fiber-fortified orange juice. Other foods Fiber bars. The items listed above may not be a complete list of recommended foods and beverages. Contact a dietitian for more information. What foods should I avoid? Fruits Fruit juice. Cooked, strained fruit. Vegetables Fried potatoes. Canned vegetables. Well-cooked vegetables. Grains White bread. Pasta made with refined flour. White rice. Meats and other proteins Fatty cuts of meat. Fried chicken or fried fish. Dairy Milk. Yogurt. Cream cheese. Sour cream. Fats and oils Butters. Beverages Soft drinks. Other foods Cakes and pastries. The items listed above may not be a complete list of foods and beverages to avoid. Talk with your dietitian about what choices are best for you. Summary Fiber is a type of carbohydrate. It is found in foods such as fruits, vegetables, whole grains, and beans. A high-fiber diet has many benefits. It can help to prevent constipation, lower blood cholesterol, aid weight loss, and reduce your risk of heart disease, diabetes, and certain cancers. Increase your intake of fiber gradually. Increasing fiber too quickly may cause cramping, bloating, and gas. Drink plenty of water while you increase the amount of fiber you consume. The best sources of fiber include whole fruits and vegetables, whole grains, nuts, seeds, and beans. This information is not intended to replace advice given to you by your health care provider. Make sure you discuss any questions you have with your healthcare provider. Document Revised: 12/17/2019 Document Reviewed: 12/17/2019 Elsevier Patient Education  2022 [employer].

## 2021-05-03 ENCOUNTER — Encounter (INDEPENDENT_AMBULATORY_CARE_PROVIDER_SITE_OTHER): Payer: 59 | Admitting: Internal Medicine

## 2021-05-04 ENCOUNTER — Other Ambulatory Visit (HOSPITAL_BASED_OUTPATIENT_CLINIC_OR_DEPARTMENT_OTHER): Payer: Self-pay

## 2021-05-09 ENCOUNTER — Encounter: Payer: Self-pay | Admitting: Pharmacist Clinician (PhC)/ Clinical Pharmacy Specialist

## 2021-05-09 ENCOUNTER — Other Ambulatory Visit: Payer: Self-pay

## 2021-05-09 ENCOUNTER — Ambulatory Visit (INDEPENDENT_AMBULATORY_CARE_PROVIDER_SITE_OTHER): Payer: 59 | Admitting: Pharmacist Clinician (PhC)/ Clinical Pharmacy Specialist

## 2021-05-09 ENCOUNTER — Telehealth: Payer: Self-pay | Admitting: Pharmacist Clinician (PhC)/ Clinical Pharmacy Specialist

## 2021-05-09 DIAGNOSIS — E785 Hyperlipidemia, unspecified: Secondary | ICD-10-CM | POA: Diagnosis not present

## 2021-05-09 NOTE — Telephone Encounter (Signed)
ORDERED AND RELEASED LABS PA SUBMITTED: Kathleen Luna (Key: [id number]) Repatha SureClick '140MG'$ /ML auto-injectors  Form MedImpact ePA Form 2017 NCPDP

## 2021-05-09 NOTE — Assessment & Plan Note (Signed)
Patient with familial hyperlipidemia and LDL most recently at 11.  She has failed multiple statin drugs because of myalgias.  Reviewed options for lowering LDL cholesterol, including ezetimibe, PCSK-9 inhibitors, bempedoic acid and inclisiran.  Discussed mechanisms of action, dosing, side effects and potential decreases in LDL cholesterol.  Also reviewed cost information and potential options for patient assistance.  Answered all patient questions.  Based on this information, patient would prefer to start a PCSK-9 inhibitor.  Once approved we will have her take Repatha 140 mg q14 days and repeat lipid and liver function labs after 4-5 doses.  She works at the [place] center and can have blood drawn while at work.  If we cannot get to goal with PCSK-9i, will consider addition of low dose statin and see if tolerated, or ezetimibe.

## 2021-05-09 NOTE — Progress Notes (Signed)
05/09/2021 RENIA GATTO Nov 20, 1965 [id number]   HPI:  Kathleen Luna is a 55 y.o. female patient of Dr Martinique, who presents today for a lipid clinic evaluation.  In addition to familial hyperlipidemia, her medical history is significant for hypertension and hypothyroidism.  She works at the [employer] location.  Patient does not have any known history of ASCVD herself, although EKG does suggest an old septal infarct.  Strong family history for heart disease, her dad and sister both died from MI, dad at age 16.    Current Medications: none  Cholesterol Goals: LDL < 100   Intolerant/previously tried: atorvastatin 40 mg, rosuvastatin, simvastatin  all caused myalgias  Family history: father died at 77 from MI, sister with familial hyperlipidemia, had MI, stents, died; at 41 niece had first MI at 75, now doing better;  brother with familial hyperlipidemia; one daughter, 53 with familial hyperlipidemia and hypothyroidism  Diet: does some intermittent fasting, nothing after 5, eating out some, only occasional fast food, only occasional fried, loves f/v; veggies fresh or frozen.    Exercise:  no regular exercise  Labs:  6/22:  TC 334, TG 123, HDL 53, LDL 254   Current Outpatient Medications  Medication Sig Dispense Refill   Ascorbic Acid (VITAMIN C) 1000 MG tablet Take 1,000 mg by mouth daily.     Cholecalciferol (VITAMIN D-3) 125 MCG (5000 UT) TABS Take 2 tablets by mouth daily.     estradiol (ESTRACE) 0.5 MG tablet Take 1 tablet (0.5 mg total) by mouth daily. 30 tablet 0   estradiol (ESTRACE) 2 MG tablet Take 1 tablet (2 mg total) by mouth daily. 90 tablet 1   naproxen (NAPROSYN) 500 MG tablet Take 1 tablet (500 mg total) by mouth 2 (two) times daily. 30 tablet 0   NP THYROID 120 MG tablet TAKE 1 TABLET (120 MG TOTAL) BY MOUTH DAILY BEFORE BREAKFAST. 30 tablet 3   NP THYROID 60 MG tablet Take 1 tablet (60 mg total) by mouth daily before breakfast. 30 tablet 3   progesterone  (PROMETRIUM) 200 MG capsule TAKE 2 CAPSULES (400 MG TOTAL) BY MOUTH DAILY. 60 capsule 3   TESTOSTERONE CYPIONATE IM Inject 10 mg into the muscle 2 (two) times a week. COMPOUNDED     No current facility-administered medications for this visit.    Allergies  Allergen Reactions   Crestor [Rosuvastatin]     myaglias   Lipitor [Atorvastatin]     Myaglias    Hydrocodone Itching    Past Medical History:  Diagnosis Date   Headache(784.0)    otc med prn   Hyperlipidemia    Hypothyroidism    Hypothyroidism, adult 06/02/2019   Primary ovarian failure 06/02/2019   SVD (spontaneous vaginal delivery)    x 1   Vitamin D deficiency disease 06/02/2019    Blood pressure (!) 146/98, pulse 92, resp. rate 16, height '5\' 4"'$  (1.626 m), weight 146 lb (66.2 kg), SpO2 98 %.   Dyslipidemia Patient with familial hyperlipidemia and LDL most recently at 254.  She has failed multiple statin drugs because of myalgias.  Reviewed options for lowering LDL cholesterol, including ezetimibe, PCSK-9 inhibitors, bempedoic acid and inclisiran.  Discussed mechanisms of action, dosing, side effects and potential decreases in LDL cholesterol.  Also reviewed cost information and potential options for patient assistance.  Answered all patient questions.  Based on this information, patient would prefer to start a PCSK-9 inhibitor.  Once approved we will have her take Repatha 140  mg q14 days and repeat lipid and liver function labs after 4-5 doses.  She works at the [employer] center and can have blood drawn while at work.  If we cannot get to goal with PCSK-9i, will consider addition of low dose statin and see if tolerated, or ezetimibe.     Tommy Medal PharmD CPP [facility] [address] [place], [place] [address] [phone]

## 2021-05-09 NOTE — Patient Instructions (Signed)
Your Results:             Your most recent labs Goal  Total Cholesterol 334 < 200  Triglycerides 123 < 150  HDL (happy/good cholesterol) 53 > 40  LDL (lousy/bad cholesterol 254 < 100   Medication changes:  We will start the process to get Repatha covered by your insurance.  Once approved we will call the prescription to [place] [facility]  Lab orders:  We will repeat labs after 4-5 doses.  We will contact you when it's time for this   Thank you for choosing [facility]

## 2021-05-09 NOTE — Telephone Encounter (Signed)
Need to get PA for Repatha 140 mg q14d and repeat labs after 4-5 doses.

## 2021-05-09 NOTE — Telephone Encounter (Signed)
COPAY CARD REPATHA: RxBin: [id number] RxPCN: [id number] RxGrp: [id number] ID: [id number]

## 2021-05-15 ENCOUNTER — Other Ambulatory Visit (HOSPITAL_BASED_OUTPATIENT_CLINIC_OR_DEPARTMENT_OTHER): Payer: Self-pay

## 2021-05-15 MED ORDER — REPATHA SURECLICK 140 MG/ML ~~LOC~~ SOAJ
140.0000 mg | SUBCUTANEOUS | 11 refills | Status: DC
  Filled 2021-05-15: qty 6, 90d supply, fill #0
  Filled 2021-07-25 – 2021-07-28 (×2): qty 6, 90d supply, fill #1
  Filled 2021-10-31: qty 6, 90d supply, fill #2
  Filled 2022-01-23: qty 6, 90d supply, fill #3

## 2021-05-15 NOTE — Addendum Note (Signed)
Addended by: Allean Found on: 05/15/2021 12:56 PM   Modules accepted: Orders

## 2021-05-15 NOTE — Telephone Encounter (Signed)
Called and spoke w/pt and stated that they were approved for repatha, rx sent, and instructed the pt to complete fasting labs post 4th dose pt voiced understanding

## 2021-05-19 ENCOUNTER — Other Ambulatory Visit (HOSPITAL_BASED_OUTPATIENT_CLINIC_OR_DEPARTMENT_OTHER): Payer: Self-pay

## 2021-05-19 ENCOUNTER — Encounter (HOSPITAL_BASED_OUTPATIENT_CLINIC_OR_DEPARTMENT_OTHER): Payer: Self-pay | Admitting: Nurse Practitioner

## 2021-05-19 ENCOUNTER — Ambulatory Visit (HOSPITAL_BASED_OUTPATIENT_CLINIC_OR_DEPARTMENT_OTHER): Payer: 59 | Admitting: Nurse Practitioner

## 2021-05-19 ENCOUNTER — Other Ambulatory Visit: Payer: Self-pay

## 2021-05-19 VITALS — BP 139/65 | HR 94 | Ht 64.0 in | Wt 145.8 lb

## 2021-05-19 DIAGNOSIS — E2839 Other primary ovarian failure: Secondary | ICD-10-CM

## 2021-05-19 DIAGNOSIS — I10 Essential (primary) hypertension: Secondary | ICD-10-CM | POA: Diagnosis not present

## 2021-05-19 DIAGNOSIS — E039 Hypothyroidism, unspecified: Secondary | ICD-10-CM | POA: Diagnosis not present

## 2021-05-19 DIAGNOSIS — Z716 Tobacco abuse counseling: Secondary | ICD-10-CM

## 2021-05-19 DIAGNOSIS — E785 Hyperlipidemia, unspecified: Secondary | ICD-10-CM | POA: Diagnosis not present

## 2021-05-19 DIAGNOSIS — R87610 Atypical squamous cells of undetermined significance on cytologic smear of cervix (ASC-US): Secondary | ICD-10-CM | POA: Insufficient documentation

## 2021-05-19 DIAGNOSIS — Z0000 Encounter for general adult medical examination without abnormal findings: Secondary | ICD-10-CM

## 2021-05-19 MED ORDER — BUPROPION HCL ER (SR) 150 MG PO TB12
ORAL_TABLET | ORAL | 3 refills | Status: DC
  Filled 2021-05-19: qty 180, 90d supply, fill #0

## 2021-05-19 MED ORDER — ESTRADIOL 2 MG PO TABS
2.0000 mg | ORAL_TABLET | Freq: Every day | ORAL | 3 refills | Status: DC
Start: 2021-05-19 — End: 2022-07-04
  Filled 2021-05-19 – 2021-06-21 (×2): qty 90, 90d supply, fill #0
  Filled 2021-09-29 – 2021-11-01 (×2): qty 90, 90d supply, fill #1
  Filled 2021-12-28 – 2022-04-06 (×3): qty 90, 90d supply, fill #2

## 2021-05-19 MED ORDER — NONFORMULARY OR COMPOUNDED ITEM: 1 refills | Status: DC

## 2021-05-19 MED ORDER — NP THYROID 30 MG PO TABS
30.0000 mg | ORAL_TABLET | Freq: Every day | ORAL | 3 refills | Status: DC
  Filled 2021-05-19: qty 90, 90d supply, fill #0

## 2021-05-19 MED ORDER — NP THYROID 120 MG PO TABS
ORAL_TABLET | ORAL | 3 refills | Status: DC
Start: 2021-05-19 — End: 2022-06-16
  Filled 2021-05-19: qty 90, 90d supply, fill #0
  Filled 2021-08-22: qty 90, 90d supply, fill #1
  Filled 2021-12-07: qty 90, 90d supply, fill #2
  Filled 2022-03-14: qty 90, 90d supply, fill #3

## 2021-05-19 MED ORDER — PROGESTERONE 200 MG PO CAPS
ORAL_CAPSULE | ORAL | 3 refills | Status: DC
Start: 2021-05-19 — End: 2022-06-19
  Filled 2021-05-19: qty 180, 90d supply, fill #0
  Filled 2021-06-06: qty 174, 87d supply, fill #0
  Filled 2021-06-06: qty 6, 3d supply, fill #0
  Filled 2021-09-13: qty 180, 90d supply, fill #1
  Filled 2021-12-28: qty 120, 60d supply, fill #2
  Filled 2021-12-28: qty 60, 30d supply, fill #2
  Filled 2022-04-06: qty 81, 41d supply, fill #3
  Filled 2022-04-06: qty 64, 32d supply, fill #3

## 2021-05-19 MED ORDER — ESTRADIOL 0.5 MG PO TABS
1.0000 mg | ORAL_TABLET | Freq: Every day | ORAL | 3 refills | Status: DC
Start: 2021-05-19 — End: 2023-12-06
  Filled 2021-05-19 – 2021-06-09 (×2): qty 180, 90d supply, fill #0
  Filled 2021-09-13: qty 180, 90d supply, fill #1
  Filled 2021-11-01 – 2021-12-29 (×3): qty 180, 90d supply, fill #2

## 2021-05-19 NOTE — Assessment & Plan Note (Signed)
On bioidentical hormone therapy long term. Recent labs and recommendations from Dr. Anastasio Champion were to increase dose of estradiol. Unfortunately, she did not see this message and the change was not made.  She is eager to continue this treatment  We will plan to change estradoil to 3mg  daily and continue progesterone 400mg  daily and testosterone 10mg  twice a week.  Will plan to evaluate labs in 3 months to allow establishment of new doses for results.

## 2021-05-19 NOTE — Assessment & Plan Note (Signed)
Repatha started recently.  No concerns at this time Will plan to evaluate labs in 3 months if not completed by cardiology at that time.

## 2021-05-19 NOTE — Patient Instructions (Addendum)
Recommendations from today's visit: We will plan to start bupropion for smoking cessation. You will start one tab in the morning for 3 days and then increase to twice a day. You can try to quit as soon as 7 days after starting the twice daily dosing, but you may take longer if needed.  I have sent your other prescriptions in for you- lets plan to lower the thyroid medication afternoon dose down to 63m and see if this helps.  We have increased the estradiol to 370mand will keep the progesterone at 40027me will check labs in 3 months.   Information on diet, exercise, and health maintenance recommendations are listed below. This is information to help you be sure you are on track for optimal health and monitoring.   Please look over this and let us [place] if you have any questions or if you have completed any of the health maintenance outside of [facility] that we can be sure your records are up to date.  ___________________________________________________________  [facility] you [facility]. I am excited for the opportunity to partner with you to meet your health care goals. It was a pleasure meeting you today!  I am an Adult-Geriatric Nurse Practitioner with a background in caring for patients for more than 20 years. I provide primary care and sports medicine services to patients age 57 10d older within this office. I am also the director of the APP Fellowship with [facility] I am passionate about providing the best service to you through preventive medicine and supportive care. I consider you a part of the medical team and value your input. I work diligently to ensure that you are heard and your needs are met in a safe and effective manner. I want you to feel comfortable with me as your provider and want you to know that your health concerns are important to me.  For your information, our office hours are Monday- Friday  8:00 AM - 5:00 PM At this time I am not in the office on Wednesdays.  If you have questions or concerns, please call our office at [phone] send us [place]Chart message and we will respond as quickly as possible.   For all urgent or time sensitive needs we ask that you please call the office to avoid delays. MyChart is not constantly monitored and replies may take up to 72 business hours.  MyChart Policy: MyChart allows for you to see your visit notes, after visit summary, provider recommendations, lab and tests results, make an appointment, request refills, and contact your provider or the office for non-urgent questions or concerns. Providers are seeing patients during normal business hours and do not have built in time to review MyChart messages.  We ask that you allow a minimum of 4 business days for responses to [employer] [employer] this reason, please do not send urgent requests through [facility] call the office at [phone] MyChart concerns may require a visit. Your provider may request you schedule a virtual or in person visit to ensure we are providing the best care possible. MyChart messages sent after 4:00 PM on Friday will not be received by the provider until Monday morning.    Lab and Test Results: You will receive your lab and test results on MyChart as soon as they are completed and results have been sent by the lab or testing facility. Due to this  service, you will receive your results BEFORE your provider.  I review lab and tests results each morning prior to seeing patients. Some results require collaboration with other providers to ensure you are receiving the most appropriate care. For this reason, we ask that you please allow a minimum of 4 business days for your provider to receive and review lab and test results and contact you about these.  Most lab and test result comments from the provider will be sent through [place]. Your provider may recommend  changes to the plan of care, follow-up visits, repeat testing, ask questions, or request an office visit to discuss these results. You may reply directly to this message or call the office at [phone] to provide information for the provider or set up an appointment. In some instances, you will be called with test results and recommendations. Please let us know if this is preferred and we will make note of this in your chart to provide this for you.    If you have not heard a response to your lab or test results in 72 business hours, please call the office to let us know.   After Hours: For all non-emergency after hours needs, please call the office at [phone] and select the option to reach the on-call provider service. On-call services are shared between multiple [place] offices and therefore it will not be possible to speak directly with your provider. On-call providers may provide medical advice and recommendations, but are unable to provide refills for maintenance medications.  For all emergency or urgent medical needs after normal business hours, we recommend that you seek care at the closest Urgent Care or Emergency Department to ensure appropriate treatment in a timely manner.  [facility] [place] has a 24 hour emergency room located on the ground floor for your convenience.    Please do not hesitate to reach out to [place] with concerns.   Thank you, again, for choosing me as your health care partner. I appreciate your trust and look forward to learning more about you.   Worthy Keeler, DNP, AGNP-c ___________________________________________________________  Health Maintenance Recommendations Screening Testing Mammogram Every 1 -2 years based on history and risk factors Starting at age 70 Pap Smear Ages 21-39 every 3 years Ages 7-65 every 5 years with HPV testing More frequent testing may be required based on results and history Colon Cancer Screening Every  1-10 years based on test performed, risk factors, and history Starting at age 66 Bone Density Screening Every 2-10 years based on history Starting at age 67 for women Recommendations for men differ based on medication usage, history, and risk factors AAA Screening One time ultrasound Men 61-72 years old who have every smoked Lung Cancer Screening Low Dose Lung CT every 12 months Age 89-80 years with a 30 pack-year smoking history who still smoke or who have quit within the last 15 years  Screening Labs Routine  Labs: Complete Blood Count (CBC), Complete Metabolic Panel (CMP), Cholesterol (Lipid Panel) Every 6-12 months based on history and medications May be recommended more frequently based on current conditions or previous results Hemoglobin A1c Lab Every 3-12 months based on history and previous results Starting at age 21 or earlier with diagnosis of diabetes, high cholesterol, BMI >26, and/or risk factors Frequent monitoring for patients with diabetes to ensure blood sugar control Thyroid Panel (TSH w/ T3 & T4) Every 6 months based on history, symptoms, and risk factors May be repeated more often if on medication  HIV One time testing for all patients 13 and older May be repeated more frequently for patients with increased risk factors or exposure Hepatitis C One time testing for all patients 7 and older May be repeated more frequently for patients with increased risk factors or exposure Gonorrhea, Chlamydia Every 12 months for all sexually active persons 13-24 years Additional monitoring may be recommended for those who are considered high risk or who have symptoms PSA Men 37-39 years old with risk factors Additional screening may be recommended from age 65-69 based on risk factors, symptoms, and history  Vaccine Recommendations Tetanus Booster All adults every 10 years Flu Vaccine All patients 6 months and older every year COVID Vaccine All patients 12 years and  older Initial dosing with booster May recommend additional booster based on age and health history HPV Vaccine 2 doses all patients age 50-26 Dosing may be considered for patients over 26 Shingles Vaccine (Shingrix) 2 doses all adults 80 years and older Pneumonia (Pneumovax 23) All adults 21 years and older May recommend earlier dosing based on health history Pneumonia (Prevnar 2) All adults 14 years and older Dosed 1 year after Pneumovax 23  Additional Screening, Testing, and Vaccinations may be recommended on an individualized basis based on family history, health history, risk factors, and/or exposure.  __________________________________________________________  Diet Recommendations for All Patients  I recommend that all patients maintain a diet low in saturated fats, carbohydrates, and cholesterol. While this can be challenging at first, it is not impossible and small changes can make big differences.  Things to try: Decreasing the amount of soda, sweet tea, and/or juice to one or less per day and replace with water While water is always the first choice, if you do not like water you may consider adding a water additive without sugar to improve the taste other sugar free drinks Replace potatoes with a brightly colored vegetable at dinner Use healthy oils, such as canola oil or olive oil, instead of butter or hard margarine Limit your bread intake to two pieces or less a day Replace regular pasta with low carb pasta options Bake, broil, or grill foods instead of frying Monitor portion sizes  Eat smaller, more frequent meals throughout the day instead of large meals  An important thing to remember is, if you love foods that are not great for your health, you don't have to give them up completely. Instead, allow these foods to be a reward when you have done well. Allowing yourself to still have special treats every once in a while is a nice way to tell yourself thank you for  working hard to keep yourself healthy.   Also remember that every day is a new day. If you have a bad day and "fall off the wagon", you can still climb right back up and keep moving along on your journey!  We have resources available to help you!  Some websites that may be helpful include: www.http://carter.biz/  Www.VeryWellFit.com _____________________________________________________________  Activity Recommendations for All Patients  I recommend that all adults get at least 20 minutes of moderate physical activity that elevates your heart rate at least 5 days out of the week.  Some examples include: Walking or jogging at a pace that allows you to carry on a conversation Cycling (stationary bike or outdoors) Water aerobics Yoga Weight lifting Dancing If physical limitations prevent you from putting stress on your joints, exercise in a pool or seated in a chair are excellent options.  Do determine your  MAXIMUM heart rate for activity: YOUR AGE - 220 = MAX HeartRate   Remember! Do not push yourself too hard.  Start slowly and build up your pace, speed, weight, time in exercise, etc.  Allow your body to rest between exercise and get good sleep. You will need more water than normal when you are exerting yourself. Do not wait until you are thirsty to drink. Drink with a purpose of getting in at least 8, 8 ounce glasses of water a day plus more depending on how much you exercise and sweat.    If you begin to develop dizziness, chest pain, abdominal pain, jaw pain, shortness of breath, headache, vision changes, lightheadedness, or other concerning symptoms, stop the activity and allow your body to rest. If your symptoms are severe, seek emergency evaluation immediately. If your symptoms are concerning, but not severe, please let us know so that we can recommend further evaluation.   ________________________________________________________________

## 2021-05-19 NOTE — Progress Notes (Signed)
Kathleen Render, DNP, AGNP-c Primary Care & Sports Medicine [address]  [place], [place] [address] [phone]  New patient visit   Patient: Kathleen Luna   DOB: 1966-06-13   55 y.o. Female  MRN: [id number] Visit Date: 05/19/2021  Patient Care Team: Kathleen Luna, Kathleen Pesa, NP as PCP - General (Nurse Practitioner) Martinique, Peter M, MD as PCP - Cardiology (Cardiology)  Today's healthcare provider: Orma Render, NP   Chief Complaint  Patient presents with   Establish Care    Patient states she wants to establish care and stop smoking.   Subjective    Kathleen Luna is a 55 y.o. female who presents today as a new patient to establish care.  HPI HPI     Establish Care    Additional comments: Patient states she wants to establish care and stop smoking.      Last edited by Kathleen Luna, CMA on 05/19/2021  2:39 PM.      Former patient of Dr. Anastasio Luna. Currently on bioidentical hormone therapy from Dr. Anastasio Luna. Unfortunately, due to his recent passing, his office has now closed and she is seeking primary care services with [place].  She is interested in remaining on her current therapies of estradiol 3mg  qAM, and progesterone 400mg  qPM, and testosterone replacement of 10mg  IM twice a week.  No current concerns, side effects, or symptoms.   She is also taking NP thyroid for hypothyroidism at 120mg  qAM and 60mg  at lunch.  She reports intermittent palpitations in recent weeks, consistent with her thyroid being off. She is interested in discussing a decrease in her dosing.   She has a history of hyperlipidemia and is on repatha managed by Cardiology.  She has no concerns at this time.   She tells me today that she would like to work on smoking cessation.  She has smoked for more than 20 years and has quit in the past with Chantix.  She is eager to quit smoking and ready to make this change.   Past Medical History:  Diagnosis Date   Arthropathy of  lumbar facet joint 08/26/2018   Closed fracture of nasal bone with routine healing 05/26/2018   Headache(784.0)    otc med prn   HTN (hypertension) 05/11/2014   Hyperlipidemia    Hypothyroidism    Hypothyroidism, adult 06/02/2019   Primary ovarian failure 06/02/2019   Smoking 05/11/2014   Somatic dysfunction of right sacroiliac joint 08/11/2018   SVD (spontaneous vaginal delivery)    x 1   Tear of right acetabular labrum 12/03/2019   Vitamin D deficiency disease 06/02/2019   Past Surgical History:  Procedure Laterality Date   BREAST SURGERY  09/2010   augmentation   VULVAR LESION REMOVAL  01/17/2012   Procedure: VULVAR LESION;  Surgeon: Allena Katz, MD;  Location: [facility];  Service: Gynecology;  Laterality: N/A;  incision and drainage  of perineal abscess   WISDOM TOOTH EXTRACTION     Family Status  Relation Name Status   Mother  Deceased   Father  Deceased   Sister  Deceased   Brother  Deceased   Daughter  Alive   Family History  Problem Relation Age of Onset   Hyperlipidemia Mother    Hypertension Mother    Dementia Mother    Kathleen Luna death Father    Heart attack Father    Cancer Sister    Heart disease Sister    Cancer Brother  Hyperlipidemia Daughter    Social History   Socioeconomic History   Marital status: Single    Spouse name: Not on file   Number of children: Not on file   Years of education: Not on file   Highest education level: Not on file  Occupational History   Not on file  Tobacco Use   Smoking status: Former    Packs/day: 0.50    Years: 15.00    Pack years: 7.50    Types: Cigarettes    Quit date: 04/17/2021    Years since quitting: 0.0   Smokeless tobacco: Never  Vaping Use   Vaping Use: Former  Substance and Sexual Activity   Alcohol use: Yes    Alcohol/week: 1.0 standard drink    Types: 1 Glasses of wine per week    Comment: weekends   Drug use: No   Sexual activity: Yes    Birth control/protection: I.U.D.  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   Social Determinants of Health   Financial Resource Strain: Not on file  Food Insecurity: Not on file  Transportation Needs: Not on file  Physical Activity: Not on file  Stress: Not on file  Social Connections: Not on file   Outpatient Medications Prior to Visit  Medication Sig   Ascorbic Acid (VITAMIN C) 1000 MG tablet Take 1,000 mg by mouth daily.   Cholecalciferol (VITAMIN D-3) 125 MCG (5000 UT) TABS Take 2 tablets by mouth daily.   Evolocumab (REPATHA SURECLICK) 626 MG/ML SOAJ Inject 140 mg into the skin every 14 (fourteen) days.   naproxen (NAPROSYN) 500 MG tablet Take 1 tablet (500 mg total) by mouth 2 (two) times daily.   TESTOSTERONE CYPIONATE IM Inject 10 mg into the muscle 2 (two) times a week. COMPOUNDED   [DISCONTINUED] estradiol (ESTRACE) 0.5 MG tablet Take 1 tablet (0.5 mg total) by mouth daily.   [DISCONTINUED] estradiol (ESTRACE) 2 MG tablet Take 1 tablet (2 mg total) by mouth daily.   [DISCONTINUED] NP THYROID 120 MG tablet TAKE 1 TABLET (120 MG TOTAL) BY MOUTH DAILY BEFORE BREAKFAST.   [DISCONTINUED] NP THYROID 60 MG tablet Take 1 tablet (60 mg total) by mouth daily before breakfast.   [DISCONTINUED] progesterone (PROMETRIUM) 200 MG capsule TAKE 2 CAPSULES (400 MG TOTAL) BY MOUTH DAILY.   No facility-administered medications prior to visit.   Allergies  Allergen Reactions   Crestor [Rosuvastatin]     myaglias   Lipitor [Atorvastatin]     Myaglias    Hydrocodone Itching    Immunization History  Administered Date(s) Administered   Influenza-Unspecified 05/14/2018, 05/14/2019, 06/10/2020   Moderna Sars-Covid-2 Vaccination 04/15/2020, 06/16/2020   PFIZER(Purple Top)SARS-COV-2 Vaccination 05/09/2020, 06/02/2020   Tdap 11/09/2015    Health Maintenance  Topic Date Due   Zoster Vaccines- Shingrix (1 of 2) Never  done   MAMMOGRAM  03/10/2021   INFLUENZA VACCINE  06/12/2021 (Originally 03/27/2021)   HIV Screening  11/09/2021 (Originally 02/28/1979)   PAP SMEAR-Modifier  03/10/2022   COLONOSCOPY (Pts 45-48yrs Insurance coverage will need to be confirmed)  06/01/2024   TETANUS/TDAP  11/08/2025   COVID-19 Vaccine  Completed   Hepatitis C Screening  Completed   HPV VACCINES  Aged Out    Patient Care Team: Manali Mcelmurry, Kathleen Pesa, NP as PCP - General (Nurse Practitioner) Martinique, Peter M, MD as PCP - Cardiology (Cardiology)  Review of Systems  All review of systems negative except what is listed in the HPI    Objective    BP 139/65   Pulse 94   Ht 5\' 4"  (1.626 Luna)   Wt 145 lb 12.8 oz (66.1 kg)   SpO2 99%   BMI 25.03 kg/Luna  Physical Exam Vitals and nursing note reviewed.  Constitutional:      Appearance: Normal appearance.  HENT:     Head: Normocephalic.  Eyes:     Extraocular Movements: Extraocular movements intact.     Conjunctiva/sclera: Conjunctivae normal.     Pupils: Pupils are equal, round, and reactive to light.  Cardiovascular:     Rate and Rhythm: Normal rate and regular rhythm.     Pulses: Normal pulses.     Heart sounds: Normal heart sounds.  Pulmonary:     Effort: Pulmonary effort is normal.     Breath sounds: Normal breath sounds.  Abdominal:     General: Abdomen is flat. Bowel sounds are normal.     Palpations: Abdomen is soft.  Musculoskeletal:        General: Normal range of motion.     Cervical back: Normal range of motion.     Right lower leg: No edema.     Left lower leg: No edema.  Skin:    General: Skin is warm and dry.     Capillary Refill: Capillary refill takes less than 2 seconds.  Neurological:     General: No focal deficit present.     Mental Status: She is alert and oriented to person, place, and time.  Psychiatric:        Mood and Affect: Mood normal.        Behavior: Behavior normal.        Thought Content: Thought content normal.        Judgment: Judgment  normal.    Depression Screen PHQ 2/9 Scores 05/19/2021 11/09/2020 01/26/2020 11/09/2019  PHQ - 2 Score 0 0 0 0  PHQ- 9 Score 0 0 - -  Exception Documentation - - Medical reason Medical reason   No results found for any visits on 05/19/21.  Assessment & Plan      Problem List Items Addressed This Visit     Dyslipidemia    Repatha started recently.  No concerns at this time Will plan to evaluate labs in 3 months if not completed by cardiology at that time.       Relevant Medications   estradiol (ESTRACE) 2 MG tablet   estradiol (ESTRACE) 0.5 MG tablet   RESOLVED: HTN (hypertension) - Primary   Hypothyroidism, adult    Currently on Armour Thyroid 120mg  qAM and 60mg  at noon. Most recent labs drawn in June of this year.  Given recent palpitations, it is reasonable to decrease dosage for trial of symptoms.  No red flags present  Will decrease armour thyroid to 120mg  qAM and 30mg  q noon.  Plan to follow-up in 3 months for repeat labs.        Relevant Medications   NP THYROID 120 MG tablet   thyroid (NP THYROID) 30 MG tablet   progesterone (PROMETRIUM) 200 MG capsule   Primary ovarian failure    On bioidentical hormone therapy long term. Recent labs and recommendations from Dr. Anastasio Luna were to increase dose of estradiol. Unfortunately, she did not see this message and the change was not made.  She is eager to continue this treatment  We will plan to change estradoil to 3mg  daily  and continue progesterone 400mg  daily and testosterone 10mg  twice a week.  Will plan to evaluate labs in 3 months to allow establishment of new doses for results.        Encounter for smoking cessation counseling    Patient eager to quit smoking. Discussed option of chantix vs bupropion for medication assistance for this. She would like to proceed with bupropion treatment. Instructions for use presented to patient. Plan to follow-up in 3 months for further evaluation .      Relevant Medications    buPROPion (WELLBUTRIN SR) 150 MG 12 hr tablet     No follow-ups on file.     Kathleen Render, NP  [facility] [phone] (phone) [phone] (fax)  [place]

## 2021-05-19 NOTE — Assessment & Plan Note (Signed)
Patient eager to quit smoking. Discussed option of chantix vs bupropion for medication assistance for this. She would like to proceed with bupropion treatment. Instructions for use presented to patient. Plan to follow-up in 3 months for further evaluation .

## 2021-05-19 NOTE — Assessment & Plan Note (Signed)
Currently on Armour Thyroid 120mg  qAM and 60mg  at noon. Most recent labs drawn in June of this year.  Given recent palpitations, it is reasonable to decrease dosage for trial of symptoms.  No red flags present  Will decrease armour thyroid to 120mg  qAM and 30mg  q noon.  Plan to follow-up in 3 months for repeat labs.

## 2021-05-22 ENCOUNTER — Other Ambulatory Visit (HOSPITAL_BASED_OUTPATIENT_CLINIC_OR_DEPARTMENT_OTHER): Payer: Self-pay

## 2021-05-31 ENCOUNTER — Other Ambulatory Visit (HOSPITAL_BASED_OUTPATIENT_CLINIC_OR_DEPARTMENT_OTHER): Payer: Self-pay

## 2021-06-01 ENCOUNTER — Encounter (HOSPITAL_BASED_OUTPATIENT_CLINIC_OR_DEPARTMENT_OTHER): Payer: Self-pay

## 2021-06-02 ENCOUNTER — Other Ambulatory Visit (HOSPITAL_BASED_OUTPATIENT_CLINIC_OR_DEPARTMENT_OTHER): Payer: Self-pay

## 2021-06-06 ENCOUNTER — Other Ambulatory Visit (HOSPITAL_BASED_OUTPATIENT_CLINIC_OR_DEPARTMENT_OTHER): Payer: Self-pay

## 2021-06-07 ENCOUNTER — Other Ambulatory Visit (HOSPITAL_BASED_OUTPATIENT_CLINIC_OR_DEPARTMENT_OTHER): Payer: Self-pay

## 2021-06-09 ENCOUNTER — Other Ambulatory Visit (HOSPITAL_BASED_OUTPATIENT_CLINIC_OR_DEPARTMENT_OTHER): Payer: Self-pay

## 2021-06-21 ENCOUNTER — Other Ambulatory Visit (HOSPITAL_BASED_OUTPATIENT_CLINIC_OR_DEPARTMENT_OTHER): Payer: Self-pay

## 2021-06-22 ENCOUNTER — Other Ambulatory Visit (HOSPITAL_BASED_OUTPATIENT_CLINIC_OR_DEPARTMENT_OTHER): Payer: Self-pay

## 2021-06-23 ENCOUNTER — Ambulatory Visit (HOSPITAL_BASED_OUTPATIENT_CLINIC_OR_DEPARTMENT_OTHER): Payer: 59 | Admitting: Cardiology

## 2021-06-26 ENCOUNTER — Other Ambulatory Visit: Payer: Self-pay

## 2021-06-26 ENCOUNTER — Ambulatory Visit (INDEPENDENT_AMBULATORY_CARE_PROVIDER_SITE_OTHER): Payer: 59 | Admitting: Dermatology

## 2021-06-26 ENCOUNTER — Encounter: Payer: Self-pay | Admitting: Dermatology

## 2021-06-26 DIAGNOSIS — D692 Other nonthrombocytopenic purpura: Secondary | ICD-10-CM

## 2021-06-26 DIAGNOSIS — Z1283 Encounter for screening for malignant neoplasm of skin: Secondary | ICD-10-CM

## 2021-06-26 DIAGNOSIS — D485 Neoplasm of uncertain behavior of skin: Secondary | ICD-10-CM

## 2021-06-26 DIAGNOSIS — R202 Paresthesia of skin: Secondary | ICD-10-CM

## 2021-06-26 DIAGNOSIS — C44319 Basal cell carcinoma of skin of other parts of face: Secondary | ICD-10-CM | POA: Diagnosis not present

## 2021-06-26 NOTE — Patient Instructions (Addendum)
Dermend bruise formula for bruised arms if needed.  Itchy back-pramoxine. In [place] anti itch cream its the red bottle.   Biopsy, Surgery (Curettage) & Surgery (Excision) Aftercare Instructions  1. Okay to remove bandage in 24 hours  2. Wash area with soap and water  3. Apply Vaseline to area twice daily until healed (Not Neosporin)  4. Okay to cover with a Band-Aid to decrease the chance of infection or prevent irritation from clothing; also it's okay to uncover lesion at home.  5. Suture instructions: return to our office in 7-10 or 10-14 days for a nurse visit for suture removal. Variable healing with sutures, if pain or itching occurs call our office. It's okay to shower or bathe 24 hours after sutures are given.  6. The following risks may occur after a biopsy, curettage or excision: bleeding, scarring, discoloration, recurrence, infection (redness, yellow drainage, pain or swelling).  7. For questions, concerns and results call our office at [place] before 4pm & Friday before 3pm. Biopsy results will be available in 1 week.

## 2021-07-03 ENCOUNTER — Telehealth: Payer: Self-pay

## 2021-07-03 NOTE — Telephone Encounter (Signed)
-----   Message from Lavonna Monarch, MD sent at 06/30/2021  5:32 AM EDT ----- Schedule her with me for surgery let her know I plan on discussing all treatment options with her before proceeding

## 2021-07-03 NOTE — Telephone Encounter (Signed)
Patient aware of results and mohs   Release:  [phone]

## 2021-07-04 ENCOUNTER — Telehealth: Payer: Self-pay | Admitting: Dermatology

## 2021-07-04 NOTE — Telephone Encounter (Signed)
She called [facility] today and they told her they haven't gotten the path report, demos, etc. that we were supposed to send them

## 2021-07-10 ENCOUNTER — Encounter: Payer: Self-pay | Admitting: Dermatology

## 2021-07-11 NOTE — Progress Notes (Signed)
   Follow-Up Visit   Subjective  Kathleen Luna is a 55 y.o. female who presents for the following: Skin Problem (Patient here today for lesion on her right cheek x 1 month that does itch, scaly, and bleeding. Patient also states theres a area on her back x years that itches, no sign of lesion. No personal history or family history of atypical moles, melanoma or non mole skin cancer. ).  General skin examination, several issues to discuss including growth on right cheek that has bled Location:  Duration:  Quality:  Associated Signs/Symptoms: Modifying Factors:  Severity:  Timing: Context:   Objective  Well appearing patient in no apparent distress; mood and affect are within normal limits. Left Forearm - Posterior, Right Forearm - Posterior (2) Several 1 cm ecchymoses on arms, no history of any other abnormal bleeding.  Mid Back General skin examination: No atypical pigmented lesions.  1 possible nonmelanoma skin cancer right back cheek will be biopsied.  Mid Back Central back itching without visible rash or growth.  Right Preauricular Area Pearly 6 mm papule with central erosion suggestive of BCC.       A full examination was performed including scalp, head, eyes, ears, nose, lips, neck, chest, axillae, abdomen, back, buttocks, bilateral upper extremities, bilateral lower extremities, hands, feet, fingers, toes, fingernails, and toenails. All findings within normal limits unless otherwise noted below.  Areas beneath undergarments not fully examined.   Assessment & Plan    Solar purpura ([place]) (3) Left Forearm - Posterior; Right Forearm - Posterior (2)  Dermend over-the-counter bruise lotion, apply daily to areas prone to bruise.  Screening for malignant neoplasm of skin Mid Back  Annual skin examination.  Encouraged to self examine twice annually.  Continued ultraviolet protection.  Notalgia paresthetica Mid Back  Cerave pramoxine (itch relief).  Apply daily  after bathing.  May use on a as needed basis.  Neoplasm of uncertain behavior of skin Right Preauricular Area  Skin / nail biopsy Type of biopsy: tangential   Informed consent: discussed and consent obtained   Timeout: patient name, date of birth, surgical site, and procedure verified   Anesthesia: the lesion was anesthetized in a standard fashion   Anesthetic:  1% lidocaine w/ epinephrine 1-100,000 local infiltration Instrument used: flexible razor blade   Hemostasis achieved with: ferric subsulfate   Outcome: patient tolerated procedure well   Post-procedure details: wound care instructions given    Specimen 1 - Surgical pathology Differential Diagnosis: scc vs bcc  Check Margins: No      I, Lavonna Monarch, MD, have reviewed all documentation for this visit.  The documentation on 07/11/21 for the exam, diagnosis, procedures, and orders are all accurate and complete.

## 2021-07-14 DIAGNOSIS — E785 Hyperlipidemia, unspecified: Secondary | ICD-10-CM | POA: Diagnosis not present

## 2021-07-15 LAB — LIPID PANEL
Chol/HDL Ratio: 5.2 ratio — ABNORMAL HIGH (ref 0.0–4.4)
Cholesterol, Total: 207 mg/dL — ABNORMAL HIGH (ref 100–199)
HDL: 40 mg/dL (ref >39)
LDL Chol Calc (NIH): 144 mg/dL — ABNORMAL HIGH (ref 0–99)
Triglycerides: 128 mg/dL (ref 0–149)
VLDL Cholesterol Cal: 23 mg/dL (ref 5–40)

## 2021-07-15 LAB — HEPATIC FUNCTION PANEL
ALT: 12 IU/L (ref 0–32)
AST: 15 IU/L (ref 0–40)
Albumin: 4.4 g/dL (ref 3.8–4.9)
Alkaline Phosphatase: 89 IU/L (ref 44–121)
Bilirubin Total: 0.5 mg/dL (ref 0.0–1.2)
Bilirubin, Direct: 0.12 mg/dL (ref 0.00–0.40)
Total Protein: 6.8 g/dL (ref 6.0–8.5)

## 2021-07-17 ENCOUNTER — Encounter: Payer: Self-pay | Admitting: Pharmacist Clinician (PhC)/ Clinical Pharmacy Specialist

## 2021-07-18 ENCOUNTER — Other Ambulatory Visit (HOSPITAL_BASED_OUTPATIENT_CLINIC_OR_DEPARTMENT_OTHER): Payer: Self-pay

## 2021-07-18 MED ORDER — EZETIMIBE 10 MG PO TABS
10.0000 mg | ORAL_TABLET | Freq: Every day | ORAL | 3 refills | Status: DC
  Filled 2021-07-18: qty 90, 90d supply, fill #0
  Filled 2021-12-07: qty 90, 90d supply, fill #1
  Filled 2022-04-06: qty 90, 90d supply, fill #2

## 2021-07-26 ENCOUNTER — Other Ambulatory Visit (HOSPITAL_BASED_OUTPATIENT_CLINIC_OR_DEPARTMENT_OTHER): Payer: Self-pay

## 2021-07-28 ENCOUNTER — Other Ambulatory Visit (HOSPITAL_BASED_OUTPATIENT_CLINIC_OR_DEPARTMENT_OTHER): Payer: Self-pay

## 2021-08-07 ENCOUNTER — Other Ambulatory Visit (HOSPITAL_BASED_OUTPATIENT_CLINIC_OR_DEPARTMENT_OTHER): Payer: Self-pay

## 2021-08-10 DIAGNOSIS — D1722 Benign lipomatous neoplasm of skin and subcutaneous tissue of left arm: Secondary | ICD-10-CM | POA: Diagnosis not present

## 2021-08-10 DIAGNOSIS — Z1231 Encounter for screening mammogram for malignant neoplasm of breast: Secondary | ICD-10-CM | POA: Diagnosis not present

## 2021-08-10 DIAGNOSIS — M1812 Unilateral primary osteoarthritis of first carpometacarpal joint, left hand: Secondary | ICD-10-CM | POA: Diagnosis not present

## 2021-08-22 ENCOUNTER — Other Ambulatory Visit (HOSPITAL_BASED_OUTPATIENT_CLINIC_OR_DEPARTMENT_OTHER): Payer: Self-pay

## 2021-08-22 DIAGNOSIS — Z01419 Encounter for gynecological examination (general) (routine) without abnormal findings: Secondary | ICD-10-CM | POA: Diagnosis not present

## 2021-08-22 DIAGNOSIS — Z720 Tobacco use: Secondary | ICD-10-CM | POA: Diagnosis not present

## 2021-08-22 DIAGNOSIS — Z6824 Body mass index (BMI) 24.0-24.9, adult: Secondary | ICD-10-CM | POA: Diagnosis not present

## 2021-08-22 MED ORDER — VARENICLINE TARTRATE (STARTER) 0.5 MG X 11 & 1 MG X 42 PO TBPK
ORAL_TABLET | ORAL | 0 refills | Status: DC
  Filled 2021-08-22: qty 53, 28d supply, fill #0

## 2021-08-29 ENCOUNTER — Encounter (HOSPITAL_BASED_OUTPATIENT_CLINIC_OR_DEPARTMENT_OTHER): Payer: Self-pay | Admitting: Nurse Practitioner

## 2021-08-29 DIAGNOSIS — C4490 Unspecified malignant neoplasm of skin, unspecified: Secondary | ICD-10-CM

## 2021-09-04 NOTE — Progress Notes (Signed)
Cardiology Office Note   Date:  09/15/2021   ID:  Kathleen, Luna 1966-03-12, MRN [id number]  PCP:  Kathleen Render, NP  Cardiologist:   Friedrich Harriott [place], MD   Chief Complaint  Patient presents with   Hyperlipidemia      History of Present Illness: Kathleen Luna is a 56 y.o. female who presents for follow up chest pain. She has a hx of abnormal EKG with normal stress test, essential hypertension, and tobacco abuse.   She was seen  on 06/09/2014 for evaluation after she had an abnormal EKG and left arm pain.  Her EKG suggested old septal infarct.  Her risk factors included hypertension and tobacco abuse as well as family history for coronary artery disease.  Her left arm pain resolved.  She underwent stress testing on 05/19/2014.  Her nuclear stress test showed low risk with mild breast attenuation artifact.  She was not noted to have ST or T wave changes.   Noted intolerance of statins. Was referred to lipid clinic. Started on Repatha with improvement in LDL from 254 to 144. Zetia then added in November.  She was seen in the ED in August. She did have a chest CT in August showing aortic atherosclerosis and calcification as well as mild coronary calcification. No PE. Troponins were negative. No chest pain since then.   She is still smoking but trying to quit. Planning to start Chantix. Has a very strong family history of premature CAD.       Past Medical History:  Diagnosis Date   Arthropathy of lumbar facet joint 08/26/2018   Closed fracture of nasal bone with routine healing 05/26/2018   Headache(784.0)    otc med prn   HTN (hypertension) 05/11/2014   Hyperlipidemia    Hypothyroidism    Hypothyroidism, adult 06/02/2019   Primary ovarian failure 06/02/2019   Smoking 05/11/2014   Somatic dysfunction of right sacroiliac joint 08/11/2018   SVD (spontaneous vaginal delivery)    x 1   Tear of right acetabular labrum 12/03/2019   Vitamin D deficiency disease 06/02/2019    Past  Surgical History:  Procedure Laterality Date   BREAST SURGERY  09/2010   augmentation   VULVAR LESION REMOVAL  01/17/2012   Procedure: VULVAR LESION;  Surgeon: Allena Katz, MD;  Location: [facility];  Service: Gynecology;  Laterality: N/A;  incision and drainage  of perineal abscess   WISDOM TOOTH EXTRACTION       Current Outpatient Medications  Medication Sig Dispense Refill   Ascorbic Acid (VITAMIN C) 1000 MG tablet Take 1,000 mg by mouth daily.     Cholecalciferol (VITAMIN D-3) 125 MCG (5000 UT) TABS Take 2 tablets by mouth daily.     estradiol (ESTRACE) 0.5 MG tablet Take 2 tablets (1 mg total) by mouth daily. 180 tablet 3   estradiol (ESTRACE) 2 MG tablet Take 1 tablet (2 mg total) by mouth daily. 90 tablet 3   Evolocumab (REPATHA SURECLICK) 811 MG/ML SOAJ Inject 140 mg into the skin every 14 (fourteen) days. 2 mL 11   ezetimibe (ZETIA) 10 MG tablet Take 1 tablet (10 mg total) by mouth daily. 90 tablet 3   NONFORMULARY OR COMPOUNDED ITEM 10mg  Testosterone Cypoinate for intramuscular injection. Use two times per week. Compounded. 3 months supply. 1 each 1   NP THYROID 120 MG tablet TAKE 1 TABLET (120 MG TOTAL) BY MOUTH DAILY BEFORE BREAKFAST. 90 tablet 3   progesterone (PROMETRIUM) 200 MG  capsule TAKE 2 CAPSULES (400 MG TOTAL) BY MOUTH DAILY. 180 capsule 3   TESTOSTERONE CYPIONATE IM Inject 10 mg into the muscle 2 (two) times a week. COMPOUNDED     Varenicline Tartrate, Starter, 0.5 MG X 11 & 1 MG X 42 TBPK Take as directed on package 53 each 0   No current facility-administered medications for this visit.    Allergies:   Crestor [rosuvastatin], Lipitor [atorvastatin], and Hydrocodone    Social History:  The patient  reports that she quit smoking about 4 months ago. Her smoking use included cigarettes. She has a 7.50 pack-year smoking history. She has never used smokeless tobacco. She reports current alcohol use of about 1.0 standard drink per week. She reports that she does not  use drugs.   Family History:  The patient's family history includes Cancer in her brother and sister; Dementia in her mother; Early death in her father; Heart attack in her father; Heart disease in her sister; Hyperlipidemia in her daughter and mother; Hypertension in her mother.    ROS:  Please see the history of present illness.   Otherwise, review of systems are positive for none.   All other systems are reviewed and negative.    PHYSICAL EXAM: VS:  BP 118/72    Pulse 74    Ht 5\' 4"  (1.626 m)    Wt 141 lb (64 kg)    SpO2 100%    BMI 24.20 kg/m  , BMI Body mass index is 24.2 kg/m. GEN: Well nourished, well developed, in no acute distress HEENT: normal Neck: no JVD, carotid bruits, or masses Cardiac: RRR; no murmurs, rubs, or gallops,no edema  Respiratory:  clear to auscultation bilaterally, normal work of breathing GI: soft, nontender, nondistended, + BS MS: no deformity or atrophy Skin: warm and dry, no rash Neuro:  Strength and sensation are intact Psych: euthymic mood, full affect   EKG:  EKG is not ordered today. The ekg ordered today demonstrates N/A   Recent Labs: 01/25/2021: TSH <0.01 04/12/2021: BUN 12; Creatinine, Ser 0.44; Hemoglobin 13.5; Platelets 309; Potassium 3.6; Sodium 138 07/14/2021: ALT 12    Lipid Panel    Component Value Date/Time   CHOL 207 (H) 07/14/2021 0826   TRIG 128 07/14/2021 0826   HDL 40 07/14/2021 0826   CHOLHDL 5.2 (H) 07/14/2021 0826   CHOLHDL 6.3 (H) 01/25/2021 1330   LDLCALC 144 (H) 07/14/2021 0826   LDLCALC 254 (H) 01/25/2021 1330      Wt Readings from Last 3 Encounters:  09/15/21 141 lb (64 kg)  05/19/21 145 lb 12.8 oz (66.1 kg)  05/09/21 146 lb (66.2 kg)      Other studies Reviewed: Additional studies/ records that were reviewed today include:   Nuclear stress test 05/19/2014 Indication for Stress Test: Evaluation for Ischemia and Abnormal EKG  History: no prior nuclear MPI  Cardiac Risk Factors: Family History - CAD,  Hypertension, Lipids and Smoker  Symptoms: SOB and Left arm pain  Nuclear Pre-Procedure  Caffeine/Decaff Intake: 7:00pm  NPO After: 5:00am   IV Site: R Antecubital  IV 0.9% NS with Angio Cath: 22g   Chest Size (in): n/a  IV Started by: Otho Perl, CNMT   Height: 5\' 4"  (1.626 m)  Cup Size: 36D Bil Implants   BMI: Body mass index is 23.68 kg/(m^2).  Weight: 138 lb (62.596 kg)     Tech Comments: Bilateral breast implants   Nuclear Med Study  1 or 2 day study: 1 day  Stress Test Type: Stress   Order Authorizing Provider: Mystique Bjelland [place], MD     Resting Radionuclide: Technetium 33m Sestamibi  Resting Radionuclide Dose: 10.3 mCi   Stress Radionuclide: Technetium 72m Sestamibi  Stress Radionuclide Dose: 29.8 mCi   Stress Protocol  Rest HR: 74  Stress HR:148   Rest BP: 150/99  Stress BP: 177/98   Exercise Time (min): 10:02  METS: 10.80   Predicted Max HR: 170 bpm  % Max HR: 87.06 bpm  Rate Pressure Product: 26196  Dose of Adenosine (mg): n/a  Dose of Lexiscan: n/a mg   Dose of Atropine (mg): n/a  Dose of Dobutamine: n/a mcg/kg/min (at max HR)   Stress Test Technologist: Mellody Memos, CCT  Nuclear Technologist: Imagene Riches, CNMT   Rest Procedure: Myocardial perfusion imaging was performed at rest 45 minutes following the intravenous administration of Technetium 75m Sestamibi.  Stress Procedure: The patient performed treadmill exercise using a Bruce Protocol for 10 minutes 2 seconds. The patient stopped due to extreme head pain and denied any chest pain. There were no significant ST-T wave changes. Technetium 64m Sestamibi was injected IV at peak exercise and myocardial perfusion imaging was performed after a brief delay.  Transient Ischemic Dilatation (Normal <1.22): 1.01  QGS EDV: 85 ml  QGS ESV: 30 ml  LV Ejection Fraction: 64%  Rest ECG: NSR - Normal EKG  Stress ECG: No significant change from baseline ECG  QPS  Raw Data Images: There is a breast shadow that accounts for the anterior  attenuation.  Stress Images: Normal homogeneous uptake in all areas of the myocardium.  Rest Images: Comparison with the stress images reveals no significant change.  Subtraction (SDS): No evidence of ischemia.  LV Wall Motion: NL LV Function; NL Wall Motion  Impression  Exercise Capacity: Good exercise capacity.  BP Response: Hypertensive blood pressure response.  Clinical Symptoms: No significant symptoms noted.  ECG Impression: No significant ST segment change suggestive of ischemia.  Comparison with Prior Nuclear Study: No previous nuclear study performed  Overall Impression: Low risk stress nuclear study with mild breast attenuation artifact.[name], MD  05/19/2014 10:45 AM     ASSESSMENT AND PLAN:  1.  CAD- mild coronary calcification on CT. No active chest pain. Focus on risk factor modification with lipid lowering and smoking cessation. Follow up in one year.    2. Familial Hyperlipidemia-01/25/2021: Cholesterol 334; HDL 53; LDL Cholesterol (Calc) 254; Triglycerides 123. Intolerant of statins.  Now on Repatha and Zetia.  Follow up lab in one month. Followed by lipid clinic.  Heart healthy low-sodium high-fiber diet Regular aerobic activity.   3. Essential hypertension- Well-controlled  Continue to monitor, blood pressure log Heart healthy low-sodium diet-salty 6 given Increase physical activity as tolerated  4. Tobacco abuse. Counseled on importance of cessation.   Current medicines are reviewed at length with the patient today.  The patient does not have concerns regarding medicines.  The following changes have been made:  no change  Labs/ tests ordered today include:  No orders of the defined types were placed in this encounter.        Disposition:   FU with me in 1 year  Signed, Cherry Wittwer [place], MD  09/15/2021 2:04 PM    [facility] [address] [place], [place], [address] Phone [phone], Fax [phone]

## 2021-09-05 ENCOUNTER — Encounter (HOSPITAL_BASED_OUTPATIENT_CLINIC_OR_DEPARTMENT_OTHER): Payer: Self-pay | Admitting: Nurse Practitioner

## 2021-09-11 ENCOUNTER — Encounter (HOSPITAL_BASED_OUTPATIENT_CLINIC_OR_DEPARTMENT_OTHER): Payer: Self-pay | Admitting: Nurse Practitioner

## 2021-09-11 DIAGNOSIS — I10 Essential (primary) hypertension: Secondary | ICD-10-CM

## 2021-09-14 ENCOUNTER — Other Ambulatory Visit (HOSPITAL_BASED_OUTPATIENT_CLINIC_OR_DEPARTMENT_OTHER): Payer: Self-pay

## 2021-09-15 ENCOUNTER — Ambulatory Visit (INDEPENDENT_AMBULATORY_CARE_PROVIDER_SITE_OTHER): Payer: No Typology Code available for payment source | Admitting: Cardiology

## 2021-09-15 ENCOUNTER — Other Ambulatory Visit: Payer: Self-pay

## 2021-09-15 ENCOUNTER — Encounter: Payer: Self-pay | Admitting: Cardiology

## 2021-09-15 VITALS — BP 118/72 | HR 74 | Ht 64.0 in | Wt 141.0 lb

## 2021-09-15 DIAGNOSIS — E7801 Familial hypercholesterolemia: Secondary | ICD-10-CM

## 2021-09-15 DIAGNOSIS — Z720 Tobacco use: Secondary | ICD-10-CM

## 2021-09-15 DIAGNOSIS — I2510 Atherosclerotic heart disease of native coronary artery without angina pectoris: Secondary | ICD-10-CM | POA: Diagnosis not present

## 2021-09-29 ENCOUNTER — Other Ambulatory Visit (HOSPITAL_BASED_OUTPATIENT_CLINIC_OR_DEPARTMENT_OTHER): Payer: Self-pay

## 2021-10-06 ENCOUNTER — Telehealth: Payer: Self-pay

## 2021-10-06 ENCOUNTER — Encounter: Payer: Self-pay | Admitting: Physician Assistant

## 2021-10-06 NOTE — Telephone Encounter (Signed)
Left a message for the patient to call back and speak to the on-call preop APP of the day 

## 2021-10-06 NOTE — Telephone Encounter (Signed)
° °  Name: Kathleen Luna  DOB: Jul 26, 1966  MRN: [id number]   Primary Cardiologist: Peter Martinique, MD  Chart reviewed as part of pre-operative protocol coverage. Patient was contacted 10/06/2021 in reference to pre-operative risk assessment for pending surgery as outlined below.  Kathleen Luna was last seen on 09/15/2021 by Dr. Peter Martinique.  Since that day, Kathleen Luna has done well without exertional chest pain or worsening dyspnea.  Therefore, based on ACC/AHA guidelines, the patient would be at acceptable risk for the planned procedure without further cardiovascular testing.   Patient does not take any aspirin.  The patient was advised that if she develops new symptoms prior to surgery to contact our office to arrange for a follow-up visit, and she verbalized understanding.  I will route this recommendation to the requesting party via Epic fax function and remove from pre-op pool. Please call with questions.  [place], [place] 10/06/2021, 4:49 PM

## 2021-10-06 NOTE — Telephone Encounter (Signed)
° °  Pre-operative Risk Assessment    Patient Name: DENNY LAVE  DOB: 08-Mar-1966 MRN: [id number]      Request for Surgical Clearance2    Procedure:   Left Thumb [facility] arthroplasty  Date of Surgery:  Clearance 11/10/21                                 Surgeon:  Mabeline Caras Surgeon's Group or Practice Name:  [facility] Phone number:  [phone] Fax number:  [phone]   Type of Clearance Requested:   - Medical  - Pharmacy:  Hold Aspirin     Type of Anesthesia:   Block with IV sed.   Additional requests/questions:  Please advise surgeon/provider what medications should be held. Please fax a copy of Clerance to the surgeon's office.  Signed, Maple Mirza   10/06/2021, 12:32 PM

## 2021-10-09 ENCOUNTER — Other Ambulatory Visit (HOSPITAL_BASED_OUTPATIENT_CLINIC_OR_DEPARTMENT_OTHER): Payer: Self-pay

## 2021-10-11 ENCOUNTER — Ambulatory Visit: Payer: No Typology Code available for payment source | Admitting: Physician Assistant

## 2021-10-23 ENCOUNTER — Other Ambulatory Visit (HOSPITAL_BASED_OUTPATIENT_CLINIC_OR_DEPARTMENT_OTHER): Payer: Self-pay

## 2021-10-23 ENCOUNTER — Encounter (HOSPITAL_BASED_OUTPATIENT_CLINIC_OR_DEPARTMENT_OTHER): Payer: Self-pay | Admitting: Nurse Practitioner

## 2021-10-23 DIAGNOSIS — R0981 Nasal congestion: Secondary | ICD-10-CM

## 2021-10-23 MED ORDER — AMOXICILLIN-POT CLAVULANATE 875-125 MG PO TABS
1.0000 | ORAL_TABLET | Freq: Two times a day (BID) | ORAL | 0 refills | Status: DC
  Filled 2021-10-23: qty 10, 5d supply, fill #0

## 2021-10-31 ENCOUNTER — Other Ambulatory Visit (HOSPITAL_BASED_OUTPATIENT_CLINIC_OR_DEPARTMENT_OTHER): Payer: Self-pay

## 2021-11-01 ENCOUNTER — Other Ambulatory Visit (HOSPITAL_BASED_OUTPATIENT_CLINIC_OR_DEPARTMENT_OTHER): Payer: Self-pay

## 2021-11-06 ENCOUNTER — Other Ambulatory Visit (HOSPITAL_BASED_OUTPATIENT_CLINIC_OR_DEPARTMENT_OTHER): Payer: Self-pay

## 2021-11-06 MED ORDER — OXYCODONE HCL 5 MG PO TABS
ORAL_TABLET | ORAL | 0 refills | Status: DC
  Filled 2021-11-06: qty 42, 7d supply, fill #0

## 2021-11-06 MED ORDER — ONDANSETRON HCL 8 MG PO TABS
ORAL_TABLET | ORAL | 0 refills | Status: DC
  Filled 2021-11-06 – 2021-11-09 (×2): qty 15, 5d supply, fill #0

## 2021-11-06 MED ORDER — METHOCARBAMOL 500 MG PO TABS
ORAL_TABLET | ORAL | 0 refills | Status: DC
  Filled 2021-11-06 – 2021-11-09 (×2): qty 40, 10d supply, fill #0

## 2021-11-08 ENCOUNTER — Other Ambulatory Visit (HOSPITAL_BASED_OUTPATIENT_CLINIC_OR_DEPARTMENT_OTHER): Payer: Self-pay

## 2021-11-09 ENCOUNTER — Other Ambulatory Visit (HOSPITAL_BASED_OUTPATIENT_CLINIC_OR_DEPARTMENT_OTHER): Payer: Self-pay

## 2021-11-09 MED ORDER — OXYCODONE HCL 5 MG PO TABS
ORAL_TABLET | ORAL | 0 refills | Status: DC
  Filled 2021-11-09: qty 42, 7d supply, fill #0

## 2021-11-09 MED ORDER — FLUCONAZOLE 150 MG PO TABS
ORAL_TABLET | ORAL | 0 refills | Status: DC
  Filled 2021-11-09 (×2): qty 5, 5d supply, fill #0

## 2021-11-10 ENCOUNTER — Other Ambulatory Visit (HOSPITAL_BASED_OUTPATIENT_CLINIC_OR_DEPARTMENT_OTHER): Payer: Self-pay

## 2021-12-07 ENCOUNTER — Other Ambulatory Visit (HOSPITAL_BASED_OUTPATIENT_CLINIC_OR_DEPARTMENT_OTHER): Payer: Self-pay

## 2021-12-13 ENCOUNTER — Other Ambulatory Visit (HOSPITAL_BASED_OUTPATIENT_CLINIC_OR_DEPARTMENT_OTHER): Payer: Self-pay

## 2021-12-14 ENCOUNTER — Other Ambulatory Visit (HOSPITAL_BASED_OUTPATIENT_CLINIC_OR_DEPARTMENT_OTHER): Payer: Self-pay

## 2021-12-14 MED ORDER — METHOCARBAMOL 500 MG PO TABS
ORAL_TABLET | ORAL | 0 refills | Status: DC
  Filled 2021-12-14: qty 40, 10d supply, fill #0

## 2021-12-28 ENCOUNTER — Encounter (HOSPITAL_BASED_OUTPATIENT_CLINIC_OR_DEPARTMENT_OTHER): Payer: Self-pay | Admitting: Pharmacist

## 2021-12-28 ENCOUNTER — Other Ambulatory Visit (HOSPITAL_BASED_OUTPATIENT_CLINIC_OR_DEPARTMENT_OTHER): Payer: Self-pay

## 2021-12-29 ENCOUNTER — Other Ambulatory Visit (HOSPITAL_BASED_OUTPATIENT_CLINIC_OR_DEPARTMENT_OTHER): Payer: Self-pay

## 2022-01-23 ENCOUNTER — Other Ambulatory Visit (HOSPITAL_BASED_OUTPATIENT_CLINIC_OR_DEPARTMENT_OTHER): Payer: Self-pay

## 2022-02-21 ENCOUNTER — Other Ambulatory Visit (HOSPITAL_BASED_OUTPATIENT_CLINIC_OR_DEPARTMENT_OTHER): Payer: Self-pay

## 2022-02-21 ENCOUNTER — Telehealth: Payer: No Typology Code available for payment source | Admitting: Physician Assistant

## 2022-02-21 DIAGNOSIS — B9789 Other viral agents as the cause of diseases classified elsewhere: Secondary | ICD-10-CM

## 2022-02-21 DIAGNOSIS — J019 Acute sinusitis, unspecified: Secondary | ICD-10-CM | POA: Diagnosis not present

## 2022-02-21 MED ORDER — BENZONATATE 100 MG PO CAPS
100.0000 mg | ORAL_CAPSULE | Freq: Three times a day (TID) | ORAL | 0 refills | Status: DC | PRN
  Filled 2022-02-21: qty 30, 5d supply, fill #0

## 2022-02-21 MED ORDER — FLUTICASONE PROPIONATE 50 MCG/ACT NA SUSP
2.0000 | Freq: Every day | NASAL | 0 refills | Status: DC
  Filled 2022-02-21: qty 16, 30d supply, fill #0

## 2022-02-21 NOTE — Progress Notes (Signed)
E-Visit for Sinus Problems  We are sorry that you are not feeling well.  Here is how we plan to help!  Based on what you have shared with me it looks like you have sinusitis.  Sinusitis is inflammation and infection in the sinus cavities of the head.  Based on your presentation I believe you most likely have Acute Viral Sinusitis.This is an infection most likely caused by a virus. There is not specific treatment for viral sinusitis other than to help you with the symptoms until the infection runs its course.  You may use an oral decongestant such as Mucinex D or if you have glaucoma or high blood pressure use plain Mucinex. Saline nasal spray help and can safely be used as often as needed for congestion, I have prescribed: Fluticasone nasal spray two sprays in each nostril once a day and tessalon perles '100mg'$  Take 1-2 capsules every 8 hours as needed for cough.   Some authorities believe that zinc sprays or the use of Echinacea may shorten the course of your symptoms.  Sinus infections are not as easily transmitted as other respiratory infection, however we still recommend that you avoid close contact with loved ones, especially the very young and elderly.  Remember to wash your hands thoroughly throughout the day as this is the number one way to prevent the spread of infection!  Home Care: Only take medications as instructed by your medical team. Do not take these medications with alcohol. A steam or ultrasonic humidifier can help congestion.  You can place a towel over your head and breathe in the steam from hot water coming from a faucet. Avoid close contacts especially the very young and the elderly. Cover your mouth when you cough or sneeze. Always remember to wash your hands.  Get Help Right Away If: You develop worsening fever or sinus pain. You develop a severe head ache or visual changes. Your symptoms persist after you have completed your treatment plan.  Make sure you Understand  these instructions. Will watch your condition. Will get help right away if you are not doing well or get worse.   Thank you for choosing an e-visit.  Your e-visit answers were reviewed by a board certified advanced clinical practitioner to complete your personal care plan. Depending upon the condition, your plan could have included both over the counter or prescription medications.  Please review your pharmacy choice. Make sure the pharmacy is open so you can pick up prescription now. If there is a problem, you may contact your provider through [employer] and have the prescription routed to another pharmacy.  Your safety is important to [place]. If you have drug allergies check your prescription carefully.   For the next 24 hours you can use MyChart to ask questions about today's visit, request a non-urgent call back, or ask for a work or school excuse. You will get an email in the next two days asking about your experience. I hope that your e-visit has been valuable and will speed your recovery.  I provided 5 minutes of non face-to-face time during this encounter for chart review and documentation.

## 2022-03-10 ENCOUNTER — Other Ambulatory Visit (HOSPITAL_BASED_OUTPATIENT_CLINIC_OR_DEPARTMENT_OTHER): Payer: Self-pay

## 2022-03-12 ENCOUNTER — Other Ambulatory Visit (HOSPITAL_BASED_OUTPATIENT_CLINIC_OR_DEPARTMENT_OTHER): Payer: Self-pay

## 2022-03-12 MED ORDER — METHOCARBAMOL 500 MG PO TABS
ORAL_TABLET | ORAL | 0 refills | Status: DC
  Filled 2022-03-12: qty 40, 13d supply, fill #0

## 2022-03-14 ENCOUNTER — Other Ambulatory Visit (HOSPITAL_BASED_OUTPATIENT_CLINIC_OR_DEPARTMENT_OTHER): Payer: Self-pay

## 2022-04-06 ENCOUNTER — Other Ambulatory Visit (HOSPITAL_BASED_OUTPATIENT_CLINIC_OR_DEPARTMENT_OTHER): Payer: Self-pay

## 2022-04-06 ENCOUNTER — Other Ambulatory Visit: Payer: Self-pay | Admitting: Cardiology

## 2022-04-06 DIAGNOSIS — E7801 Familial hypercholesterolemia: Secondary | ICD-10-CM

## 2022-04-06 DIAGNOSIS — I2510 Atherosclerotic heart disease of native coronary artery without angina pectoris: Secondary | ICD-10-CM

## 2022-04-06 MED ORDER — REPATHA SURECLICK 140 MG/ML ~~LOC~~ SOAJ
140.0000 mg | SUBCUTANEOUS | 11 refills | Status: DC
  Filled 2022-04-06: qty 2, 30d supply, fill #0
  Filled 2022-04-10: qty 6, 84d supply, fill #0
  Filled 2022-04-16: qty 2, 30d supply, fill #0
  Filled 2022-05-07: qty 2, 30d supply, fill #1
  Filled 2022-05-14: qty 6, 84d supply, fill #1
  Filled 2022-08-15: qty 6, 84d supply, fill #2
  Filled 2022-10-26 – 2022-11-19 (×4): qty 6, 84d supply, fill #3
  Filled 2023-03-09: qty 2, 28d supply, fill #4

## 2022-04-10 ENCOUNTER — Other Ambulatory Visit (HOSPITAL_BASED_OUTPATIENT_CLINIC_OR_DEPARTMENT_OTHER): Payer: Self-pay

## 2022-04-16 ENCOUNTER — Other Ambulatory Visit (HOSPITAL_BASED_OUTPATIENT_CLINIC_OR_DEPARTMENT_OTHER): Payer: Self-pay

## 2022-04-18 ENCOUNTER — Other Ambulatory Visit (HOSPITAL_BASED_OUTPATIENT_CLINIC_OR_DEPARTMENT_OTHER): Payer: Self-pay

## 2022-05-07 ENCOUNTER — Other Ambulatory Visit (HOSPITAL_BASED_OUTPATIENT_CLINIC_OR_DEPARTMENT_OTHER): Payer: Self-pay

## 2022-05-07 ENCOUNTER — Encounter (HOSPITAL_BASED_OUTPATIENT_CLINIC_OR_DEPARTMENT_OTHER): Payer: Self-pay | Admitting: Nurse Practitioner

## 2022-05-14 ENCOUNTER — Other Ambulatory Visit (HOSPITAL_BASED_OUTPATIENT_CLINIC_OR_DEPARTMENT_OTHER): Payer: Self-pay

## 2022-06-12 ENCOUNTER — Encounter (HOSPITAL_BASED_OUTPATIENT_CLINIC_OR_DEPARTMENT_OTHER): Payer: Self-pay | Admitting: Nurse Practitioner

## 2022-06-14 ENCOUNTER — Other Ambulatory Visit (HOSPITAL_BASED_OUTPATIENT_CLINIC_OR_DEPARTMENT_OTHER): Payer: Self-pay | Admitting: Nurse Practitioner

## 2022-06-14 ENCOUNTER — Ambulatory Visit (INDEPENDENT_AMBULATORY_CARE_PROVIDER_SITE_OTHER): Payer: No Typology Code available for payment source | Admitting: Nurse Practitioner

## 2022-06-14 DIAGNOSIS — E2839 Other primary ovarian failure: Secondary | ICD-10-CM

## 2022-06-15 ENCOUNTER — Encounter (HOSPITAL_BASED_OUTPATIENT_CLINIC_OR_DEPARTMENT_OTHER): Payer: Self-pay | Admitting: Nurse Practitioner

## 2022-06-16 ENCOUNTER — Other Ambulatory Visit (HOSPITAL_BASED_OUTPATIENT_CLINIC_OR_DEPARTMENT_OTHER): Payer: Self-pay | Admitting: Nurse Practitioner

## 2022-06-16 DIAGNOSIS — E039 Hypothyroidism, unspecified: Secondary | ICD-10-CM

## 2022-06-18 ENCOUNTER — Encounter (HOSPITAL_BASED_OUTPATIENT_CLINIC_OR_DEPARTMENT_OTHER): Payer: Self-pay | Admitting: Nurse Practitioner

## 2022-06-18 ENCOUNTER — Other Ambulatory Visit (HOSPITAL_BASED_OUTPATIENT_CLINIC_OR_DEPARTMENT_OTHER): Payer: Self-pay

## 2022-06-18 MED ORDER — NP THYROID 120 MG PO TABS
ORAL_TABLET | ORAL | 3 refills | Status: DC
  Filled 2022-06-18: qty 90, 90d supply, fill #0
  Filled 2022-09-18 (×2): qty 90, 90d supply, fill #1
  Filled 2022-12-18: qty 90, 90d supply, fill #2
  Filled 2023-03-09: qty 90, 90d supply, fill #3

## 2022-06-19 ENCOUNTER — Other Ambulatory Visit (HOSPITAL_BASED_OUTPATIENT_CLINIC_OR_DEPARTMENT_OTHER): Payer: Self-pay

## 2022-06-19 ENCOUNTER — Other Ambulatory Visit (HOSPITAL_BASED_OUTPATIENT_CLINIC_OR_DEPARTMENT_OTHER): Payer: Self-pay | Admitting: Nurse Practitioner

## 2022-06-19 ENCOUNTER — Telehealth (HOSPITAL_BASED_OUTPATIENT_CLINIC_OR_DEPARTMENT_OTHER): Payer: Self-pay

## 2022-06-19 DIAGNOSIS — E039 Hypothyroidism, unspecified: Secondary | ICD-10-CM

## 2022-06-19 MED ORDER — PROGESTERONE 200 MG PO CAPS
ORAL_CAPSULE | ORAL | 3 refills | Status: DC
  Filled 2022-06-26: qty 145, 72d supply, fill #0
  Filled 2022-06-26: qty 35, 18d supply, fill #0
  Filled 2022-10-26: qty 180, 90d supply, fill #1
  Filled 2023-02-23: qty 180, 90d supply, fill #2

## 2022-06-19 NOTE — Telephone Encounter (Signed)
Spoke with patient per Kathleen Luna her Estrogen is normal and per Kathleen Luna continue to take the estrodial '2mg'$  daily. Spoke with patient and she is aware. Patient is also aware that testosterone is pending final results.

## 2022-06-20 ENCOUNTER — Encounter (HOSPITAL_BASED_OUTPATIENT_CLINIC_OR_DEPARTMENT_OTHER): Payer: Self-pay | Admitting: Nurse Practitioner

## 2022-06-20 LAB — TESTOSTERONE,FREE AND TOTAL
Testosterone, Free: 0.6 pg/mL (ref 0.0–4.2)
Testosterone: 17 ng/dL (ref 4–50)

## 2022-06-20 LAB — ESTRADIOL: Estradiol: 91.2 pg/mL

## 2022-06-21 ENCOUNTER — Encounter (HOSPITAL_BASED_OUTPATIENT_CLINIC_OR_DEPARTMENT_OTHER): Payer: Self-pay | Admitting: Nurse Practitioner

## 2022-06-26 ENCOUNTER — Other Ambulatory Visit (HOSPITAL_BASED_OUTPATIENT_CLINIC_OR_DEPARTMENT_OTHER): Payer: Self-pay

## 2022-07-04 ENCOUNTER — Other Ambulatory Visit (HOSPITAL_BASED_OUTPATIENT_CLINIC_OR_DEPARTMENT_OTHER): Payer: Self-pay | Admitting: Nurse Practitioner

## 2022-07-04 DIAGNOSIS — E785 Hyperlipidemia, unspecified: Secondary | ICD-10-CM

## 2022-07-04 NOTE — Progress Notes (Signed)
Patient presents for labs only for testosterone and estrogen levels.

## 2022-07-05 ENCOUNTER — Other Ambulatory Visit (HOSPITAL_BASED_OUTPATIENT_CLINIC_OR_DEPARTMENT_OTHER): Payer: Self-pay

## 2022-07-05 MED ORDER — ESTRADIOL 2 MG PO TABS
2.0000 mg | ORAL_TABLET | Freq: Every day | ORAL | 3 refills | Status: DC
  Filled 2022-07-13: qty 90, 90d supply, fill #0
  Filled 2022-10-26: qty 90, 90d supply, fill #1
  Filled 2023-02-23: qty 90, 90d supply, fill #2
  Filled 2023-06-25: qty 90, 90d supply, fill #3
  Filled 2023-07-03: qty 90, 90d supply, fill #0

## 2022-07-05 MED ORDER — TESTOSTERONE CYPIONATE 200 MG/ML IM SOLN
100.0000 mg | INTRAMUSCULAR | 0 refills | Status: DC
  Filled 2022-07-05: qty 3, 84d supply, fill #0

## 2022-07-06 ENCOUNTER — Other Ambulatory Visit (HOSPITAL_BASED_OUTPATIENT_CLINIC_OR_DEPARTMENT_OTHER): Payer: Self-pay

## 2022-07-07 ENCOUNTER — Other Ambulatory Visit (HOSPITAL_BASED_OUTPATIENT_CLINIC_OR_DEPARTMENT_OTHER): Payer: Self-pay

## 2022-07-09 ENCOUNTER — Other Ambulatory Visit (HOSPITAL_BASED_OUTPATIENT_CLINIC_OR_DEPARTMENT_OTHER): Payer: Self-pay

## 2022-07-13 ENCOUNTER — Other Ambulatory Visit (HOSPITAL_BASED_OUTPATIENT_CLINIC_OR_DEPARTMENT_OTHER): Payer: Self-pay

## 2022-08-08 ENCOUNTER — Other Ambulatory Visit (HOSPITAL_BASED_OUTPATIENT_CLINIC_OR_DEPARTMENT_OTHER): Payer: Self-pay

## 2022-08-08 ENCOUNTER — Other Ambulatory Visit: Payer: Self-pay | Admitting: Cardiology

## 2022-08-08 MED ORDER — EZETIMIBE 10 MG PO TABS
10.0000 mg | ORAL_TABLET | Freq: Every day | ORAL | 3 refills | Status: DC
  Filled 2022-08-15: qty 90, 90d supply, fill #0
  Filled 2022-12-18: qty 90, 90d supply, fill #1
  Filled 2023-04-18: qty 90, 90d supply, fill #2
  Filled 2023-07-03: qty 90, 90d supply, fill #0
  Filled ????-??-??: fill #0

## 2022-08-15 ENCOUNTER — Other Ambulatory Visit (HOSPITAL_BASED_OUTPATIENT_CLINIC_OR_DEPARTMENT_OTHER): Payer: Self-pay

## 2022-09-04 ENCOUNTER — Encounter: Payer: Self-pay | Admitting: Internal Medicine

## 2022-09-10 ENCOUNTER — Other Ambulatory Visit (HOSPITAL_COMMUNITY): Payer: Self-pay

## 2022-09-17 DIAGNOSIS — Z1231 Encounter for screening mammogram for malignant neoplasm of breast: Secondary | ICD-10-CM | POA: Diagnosis not present

## 2022-09-17 DIAGNOSIS — R6882 Decreased libido: Secondary | ICD-10-CM | POA: Diagnosis not present

## 2022-09-17 DIAGNOSIS — Z124 Encounter for screening for malignant neoplasm of cervix: Secondary | ICD-10-CM | POA: Diagnosis not present

## 2022-09-17 DIAGNOSIS — Z01419 Encounter for gynecological examination (general) (routine) without abnormal findings: Secondary | ICD-10-CM | POA: Diagnosis not present

## 2022-09-17 DIAGNOSIS — Z6825 Body mass index (BMI) 25.0-25.9, adult: Secondary | ICD-10-CM | POA: Diagnosis not present

## 2022-09-18 ENCOUNTER — Other Ambulatory Visit (HOSPITAL_BASED_OUTPATIENT_CLINIC_OR_DEPARTMENT_OTHER): Payer: Self-pay

## 2022-09-21 ENCOUNTER — Other Ambulatory Visit (HOSPITAL_BASED_OUTPATIENT_CLINIC_OR_DEPARTMENT_OTHER): Payer: Self-pay

## 2022-09-21 MED ORDER — DIETHYLPROPION HCL ER 75 MG PO TB24
75.0000 mg | ORAL_TABLET | Freq: Every day | ORAL | 3 refills | Status: DC
  Filled 2022-09-21: qty 30, 30d supply, fill #0
  Filled 2022-11-24: qty 30, 30d supply, fill #1
  Filled 2023-01-16: qty 30, 30d supply, fill #2
  Filled 2023-03-09: qty 30, 30d supply, fill #3

## 2022-10-26 ENCOUNTER — Other Ambulatory Visit (HOSPITAL_BASED_OUTPATIENT_CLINIC_OR_DEPARTMENT_OTHER): Payer: Self-pay

## 2022-10-26 ENCOUNTER — Other Ambulatory Visit: Payer: Self-pay

## 2022-10-26 MED ORDER — VARENICLINE TARTRATE (STARTER) 0.5 MG X 11 & 1 MG X 42 PO TBPK
ORAL_TABLET | ORAL | 0 refills | Status: DC
  Filled 2022-10-26: qty 53, 28d supply, fill #0

## 2022-10-26 MED ORDER — TESTOSTERONE CYPIONATE 200 MG/ML IM SOLN
INTRAMUSCULAR | 0 refills | Status: DC
  Filled 2022-10-26: qty 3, 84d supply, fill #0

## 2022-10-27 ENCOUNTER — Other Ambulatory Visit (HOSPITAL_BASED_OUTPATIENT_CLINIC_OR_DEPARTMENT_OTHER): Payer: Self-pay

## 2022-10-27 ENCOUNTER — Encounter: Payer: Self-pay | Admitting: Cardiology

## 2022-10-29 ENCOUNTER — Other Ambulatory Visit (HOSPITAL_COMMUNITY): Payer: Self-pay

## 2022-11-03 ENCOUNTER — Other Ambulatory Visit (HOSPITAL_BASED_OUTPATIENT_CLINIC_OR_DEPARTMENT_OTHER): Payer: Self-pay

## 2022-11-05 ENCOUNTER — Other Ambulatory Visit (HOSPITAL_BASED_OUTPATIENT_CLINIC_OR_DEPARTMENT_OTHER): Payer: Self-pay

## 2022-11-12 ENCOUNTER — Other Ambulatory Visit (HOSPITAL_BASED_OUTPATIENT_CLINIC_OR_DEPARTMENT_OTHER): Payer: Self-pay

## 2022-11-15 ENCOUNTER — Other Ambulatory Visit (HOSPITAL_BASED_OUTPATIENT_CLINIC_OR_DEPARTMENT_OTHER): Payer: Self-pay

## 2022-11-15 DIAGNOSIS — L814 Other melanin hyperpigmentation: Secondary | ICD-10-CM | POA: Diagnosis not present

## 2022-11-15 DIAGNOSIS — D2372 Other benign neoplasm of skin of left lower limb, including hip: Secondary | ICD-10-CM | POA: Diagnosis not present

## 2022-11-15 DIAGNOSIS — Z08 Encounter for follow-up examination after completed treatment for malignant neoplasm: Secondary | ICD-10-CM | POA: Diagnosis not present

## 2022-11-15 DIAGNOSIS — Z85828 Personal history of other malignant neoplasm of skin: Secondary | ICD-10-CM | POA: Diagnosis not present

## 2022-11-15 MED ORDER — FLUOCINONIDE 0.05 % EX CREA
TOPICAL_CREAM | CUTANEOUS | 0 refills | Status: DC
  Filled 2022-11-15: qty 60, 30d supply, fill #0

## 2022-11-19 ENCOUNTER — Other Ambulatory Visit (HOSPITAL_BASED_OUTPATIENT_CLINIC_OR_DEPARTMENT_OTHER): Payer: Self-pay

## 2022-11-19 ENCOUNTER — Other Ambulatory Visit: Payer: Self-pay

## 2022-11-24 ENCOUNTER — Other Ambulatory Visit (HOSPITAL_BASED_OUTPATIENT_CLINIC_OR_DEPARTMENT_OTHER): Payer: Self-pay

## 2022-11-26 ENCOUNTER — Other Ambulatory Visit: Payer: Self-pay

## 2022-12-10 ENCOUNTER — Encounter: Payer: Self-pay | Admitting: *Deleted

## 2022-12-17 ENCOUNTER — Other Ambulatory Visit (HOSPITAL_BASED_OUTPATIENT_CLINIC_OR_DEPARTMENT_OTHER): Payer: Self-pay

## 2022-12-17 DIAGNOSIS — M25511 Pain in right shoulder: Secondary | ICD-10-CM | POA: Diagnosis not present

## 2022-12-17 DIAGNOSIS — M5412 Radiculopathy, cervical region: Secondary | ICD-10-CM | POA: Diagnosis not present

## 2022-12-17 MED ORDER — CYCLOBENZAPRINE HCL 10 MG PO TABS
10.0000 mg | ORAL_TABLET | Freq: Three times a day (TID) | ORAL | 0 refills | Status: DC
  Filled 2022-12-17: qty 30, 10d supply, fill #0

## 2022-12-17 MED ORDER — PREDNISONE 10 MG PO TABS
ORAL_TABLET | ORAL | 0 refills | Status: DC
  Filled 2022-12-17: qty 21, 6d supply, fill #0

## 2022-12-18 ENCOUNTER — Other Ambulatory Visit (HOSPITAL_BASED_OUTPATIENT_CLINIC_OR_DEPARTMENT_OTHER): Payer: Self-pay

## 2022-12-24 ENCOUNTER — Other Ambulatory Visit (HOSPITAL_BASED_OUTPATIENT_CLINIC_OR_DEPARTMENT_OTHER): Payer: Self-pay

## 2022-12-24 DIAGNOSIS — M5412 Radiculopathy, cervical region: Secondary | ICD-10-CM | POA: Diagnosis not present

## 2022-12-24 MED ORDER — TIZANIDINE HCL 4 MG PO TABS
4.0000 mg | ORAL_TABLET | Freq: Three times a day (TID) | ORAL | 0 refills | Status: DC
  Filled 2022-12-24: qty 30, 10d supply, fill #0

## 2023-01-02 DIAGNOSIS — M5412 Radiculopathy, cervical region: Secondary | ICD-10-CM | POA: Diagnosis not present

## 2023-01-03 ENCOUNTER — Other Ambulatory Visit (HOSPITAL_BASED_OUTPATIENT_CLINIC_OR_DEPARTMENT_OTHER): Payer: Self-pay

## 2023-01-07 ENCOUNTER — Other Ambulatory Visit (HOSPITAL_BASED_OUTPATIENT_CLINIC_OR_DEPARTMENT_OTHER): Payer: Self-pay

## 2023-01-07 MED ORDER — CYCLOBENZAPRINE HCL 10 MG PO TABS
10.0000 mg | ORAL_TABLET | Freq: Three times a day (TID) | ORAL | 0 refills | Status: DC
  Filled 2023-01-07: qty 30, 10d supply, fill #0

## 2023-01-08 DIAGNOSIS — M5412 Radiculopathy, cervical region: Secondary | ICD-10-CM | POA: Diagnosis not present

## 2023-01-15 NOTE — Progress Notes (Signed)
Cardiology Office Note:    Date:  01/18/2023   ID:  Kathleen Luna, DOB 1965/11/28, MRN [id number]  PCP:  Tollie Eth, NP   [address] [facility] Providers Cardiologist:  Peter Swaziland, MD      Referring MD: Tollie Eth, NP   Presents to clinic for an evaluation of her chest pressure  History of Present Illness:    Kathleen Luna is a 57 y.o. female with a hx of abnormal EKG with normal stress test, essential hypertension, and tobacco abuse.  She was seen by Dr. Swaziland on 06/09/2014 for evaluation after she had an abnormal EKG and left arm pain.  Her EKG suggested old septal infarct.  Her risk factors included hypertension and tobacco abuse as well as family history for coronary artery disease.  Her left arm pain resolved.  She underwent stress testing on 05/19/2014.  Her nuclear stress test showed low risk with mild breast attenuation artifact.  She was not noted to have ST or T wave changes.  She presented to the clinic 04/18/21 for evaluation of chest pressure.  She stated she felt well.  She  had no further episodes of chest discomfort.  We reviewed her lab work from the emergency department and her chest CT.  She expressed understanding.  She did report occasional brief episodes of palpitations.  We discussed avoiding triggers for palpitations.  We reviewed her lipid panel and she reported that she had been off statin therapy for about 6 months.  She did note myalgias with the medication.  I  refered her to the lipid clinic. Follow-up with Dr. Swaziland in 4-6 months was planned.  She was seen in follow-up by Dr. Swaziland 09/15/2021.  She had been started on Repatha and her LDL improved from 254 down to 144.  Zetia was added to her medication regimen and November.  She continued to try to quit smoking.  She was planning to start Chantix.  She denied chest pain.  Her blood pressure was well-controlled.  Follow-up was planned for 1 year.  She presents to the clinic today for follow-up  evaluation and states she feels fairly well today.  Her biggest complaint is with neck pain.  She reports that she is seeing [facility] for treatment of a pinched nerve in her neck.  Her EKG today shows sinus tachycardia septal infarct undetermined age 19 bpm.  Her blood pressure is 122/80.  She reports increased stress with a recent close family friend passing away.  We discussed the importance of limiting caffeine, increasing p.o. hydration, and increasing physical activity.  I will order CBC, BMP, fasting lipids and LFTs.  We will plan follow-up in 3 to 4 months to evaluate heart rate.  Today she denies chest pain, shortness of breath, lower extremity edema, fatigue, palpitations, melena, hematuria, hemoptysis, diaphoresis, weakness, presyncope, and syncope.   Past Medical History:  Diagnosis Date   Arthropathy of lumbar facet joint 08/26/2018   Closed fracture of nasal bone with routine healing 05/26/2018   Headache(784.0)    otc med prn   HTN (hypertension) 05/11/2014   Hyperlipidemia    Hypothyroidism    Hypothyroidism, adult 06/02/2019   Primary ovarian failure 06/02/2019   Smoking 05/11/2014   Somatic dysfunction of right sacroiliac joint 08/11/2018   SVD (spontaneous vaginal delivery)    x 1   Tear of right acetabular labrum 12/03/2019   Vitamin D deficiency disease 06/02/2019    Past Surgical History:  Procedure Laterality Date  BREAST SURGERY  09/2010   augmentation   VULVAR LESION REMOVAL  01/17/2012   Procedure: VULVAR LESION;  Surgeon: Leslie Andrea, MD;  Location: [facility];  Service: Gynecology;  Laterality: N/A;  incision and drainage  of perineal abscess   WISDOM TOOTH EXTRACTION      Current Medications: Current Meds  Medication Sig   Ascorbic Acid (VITAMIN C) 1000 MG tablet Take 1,000 mg by mouth daily.   Cholecalciferol (VITAMIN D-3) 125 MCG (5000 UT) TABS Take 2 tablets by mouth daily.   cyclobenzaprine (FLEXERIL) 10 MG tablet Take 1 tablet (10 mg total)  by mouth 3 (three) times daily.   Diethylpropion HCl CR 75 MG TB24 Take 1 tablet (75 mg total) by mouth daily.   estradiol (ESTRACE) 0.5 MG tablet Take 2 tablets (1 mg total) by mouth daily.   estradiol (ESTRACE) 2 MG tablet Take 1 tablet (2 mg total) by mouth daily.   Evolocumab (REPATHA SURECLICK) 140 MG/ML SOAJ Inject 140 mg into the skin every 14 (fourteen) days.   ezetimibe (ZETIA) 10 MG tablet Take 1 tablet (10 mg total) by mouth daily.   fluconazole (DIFLUCAN) 150 MG tablet Take 1 tablet every day by oral route for 1 day.   fluocinonide cream (LIDEX) 0.05 % Apply on affected area once a day for 1-2 weeks   fluticasone (FLONASE) 50 MCG/ACT nasal spray Place 2 sprays into both nostrils daily.   NP THYROID 120 MG tablet TAKE 1 TABLET (120 MG TOTAL) BY MOUTH DAILY BEFORE BREAKFAST.   progesterone (PROMETRIUM) 200 MG capsule TAKE 2 CAPSULES (400 MG TOTAL) BY MOUTH DAILY.   testosterone cypionate (DEPOTESTOSTERONE CYPIONATE) 200 MG/ML injection Inject 0.5 mLs (100 mg total) into the muscle every 4 weeks for 3 months. Discard remainder in vial.   Varenicline Tartrate, Starter, 0.5 MG X 11 & 1 MG X 42 TBPK Take as directed on package     Allergies:   Crestor [rosuvastatin], Lipitor [atorvastatin], Oxycodone, and Hydrocodone   Social History   Socioeconomic History   Marital status: Single    Spouse name: Not on file   Number of children: Not on file   Years of education: Not on file   Highest education level: Not on file  Occupational History   Not on file  Tobacco Use   Smoking status: Former    Packs/day: 0.50    Years: 15.00    Additional pack years: 0.00    Total pack years: 7.50    Types: Cigarettes    Quit date: 04/17/2021    Years since quitting: 1.7   Smokeless tobacco: Never  Vaping Use   Vaping Use: Former  Substance and Sexual Activity   Alcohol use: Yes    Alcohol/week: 1.0 standard drink of alcohol    Types: 1 Glasses of wine per week    Comment: weekends   Drug  use: No   Sexual activity: Yes    Birth control/protection: I.U.D.  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   Social Determinants of Health   Financial Resource Strain: Not on file  Food Insecurity: Not on file  Transportation Needs: Not on file  Physical Activity: Not on file  Stress: Not on file  Social Connections: Not on file     Family History: The patient's family history includes Cancer in her brother and sister; Dementia in her mother;  Early death in her father; Heart attack in her father; Heart disease in her sister; Hyperlipidemia in her daughter and mother; Hypertension in her mother.  ROS:   Please see the history of present illness.     All other systems reviewed and are negative.   Risk Assessment/Calculations:           Physical Exam:    VS:  BP 122/80   Pulse (!) 104   Ht 5\' 4"  (1.626 m)   Wt 147 lb 9.6 oz (67 kg)   SpO2 97%   BMI 25.34 kg/m     Wt Readings from Last 3 Encounters:  01/18/23 147 lb 9.6 oz (67 kg)  09/15/21 141 lb (64 kg)  05/19/21 145 lb 12.8 oz (66.1 kg)     GEN:  Well nourished, well developed in no acute distress HEENT: Normal NECK: No JVD; No carotid bruits LYMPHATICS: No lymphadenopathy CARDIAC: RRR, no murmurs, rubs, gallops RESPIRATORY:  Clear to auscultation without rales, wheezing or rhonchi  ABDOMEN: Soft, non-tender, non-distended MUSCULOSKELETAL:  No edema; No deformity  SKIN: Warm and dry NEUROLOGIC:  Alert and oriented x 3 PSYCHIATRIC:  Normal affect    EKGs/Labs/Other Studies Reviewed:    The following studies were reviewed today:  Nuclear stress test 05/19/2014 Indication for Stress Test: Evaluation for Ischemia and Abnormal EKG  History: no prior nuclear MPI  Cardiac Risk Factors: Family History - CAD, Hypertension, Lipids and Smoker  Symptoms: SOB and Left arm pain   Nuclear Pre-Procedure  Caffeine/Decaff Intake: 7:00pm  NPO After: 5:00am   IV Site: R Antecubital  IV 0.9% NS with Angio Cath: 22g   Chest Size (in): n/a  IV Started by: Koren Shiver, CNMT   Height: 5\' 4"  (1.626 m)  Cup Size: 36D Bil Implants   BMI: Body mass index is 23.68 kg/(m^2).  Weight: 138 lb (62.596 kg)     Tech Comments: Bilateral breast implants   Nuclear Med Study  1 or 2 day study: 1 day  Stress Test Type: Stress   Order Authorizing Provider: Peter Swaziland, MD     Resting Radionuclide: Technetium 21m Sestamibi  Resting Radionuclide Dose: 10.3 mCi   Stress Radionuclide: Technetium 75m Sestamibi  Stress Radionuclide Dose: 29.8 mCi   Stress Protocol  Rest HR: 74  Stress HR:148   Rest BP: 150/99  Stress BP: 177/98   Exercise Time (min): 10:02  METS: 10.80   Predicted Max HR: 170 bpm  % Max HR: 87.06 bpm  Rate Pressure Product: 16109  Dose of Adenosine (mg): n/a  Dose of Lexiscan: n/a mg   Dose of Atropine (mg): n/a  Dose of Dobutamine: n/a mcg/kg/min (at max HR)   Stress Test Technologist: Ernestene Mention, CCT  Nuclear Technologist: Gonzella Lex, CNMT   Rest Procedure: Myocardial perfusion imaging was performed at rest 45 minutes following the intravenous administration of Technetium 39m Sestamibi.  Stress Procedure: The patient performed treadmill exercise using a Bruce Protocol for 10 minutes 2 seconds. The patient stopped due to extreme head pain and denied any chest pain. There were no significant ST-T wave changes. Technetium 64m Sestamibi was injected IV at peak exercise and myocardial perfusion imaging was performed after a brief delay.  Transient Ischemic Dilatation (Normal <1.22): 1.01  QGS EDV: 85 ml  QGS ESV: 30 ml  LV Ejection Fraction: 64%  Rest ECG: NSR - Normal EKG  Stress ECG: No significant change from baseline ECG  QPS  Raw Data Images: There is a breast shadow  that accounts for the anterior attenuation.  Stress Images: Normal homogeneous uptake in all  areas of the myocardium.  Rest Images: Comparison with the stress images reveals no significant change.  Subtraction (SDS): No evidence of ischemia.  LV Wall Motion: NL LV Function; NL Wall Motion  Impression  Exercise Capacity: Good exercise capacity.  BP Response: Hypertensive blood pressure response.  Clinical Symptoms: No significant symptoms noted.  ECG Impression: No significant ST segment change suggestive of ischemia.  Comparison with Prior Nuclear Study: No previous nuclear study performed  Overall Impression: Low risk stress nuclear study with mild breast attenuation artifact.Thurmon Fair, MD  05/19/2014 10:45 AM   EKG:  EKG is  ordered today. Sinus tachycardia septal infarct undetermined age 29 BPM.  EKG 823/22  The ekg ordered today demonstrates normal sinus rhythm septal infarct undetermined age 61 bpm  Recent Labs: No results found for requested labs within last 365 days.  Recent Lipid Panel    Component Value Date/Time   CHOL 207 (H) 07/14/2021 0826   TRIG 128 07/14/2021 0826   HDL 40 07/14/2021 0826   CHOLHDL 5.2 (H) 07/14/2021 0826   CHOLHDL 6.3 (H) 01/25/2021 1330   LDLCALC 144 (H) 07/14/2021 0826   LDLCALC 254 (H) 01/25/2021 1330    ASSESSMENT & PLAN   Sinus tachycardia- EKG today shows sinus tachycardiac 104. Neck pain from nerve in neck.  Increase stress due to friend's husband dying a few days ago. Increase po hydration Increase physical activity as tolerated Limit caffeine Seeing [facility] for trapped nerve in neck  Coronary artery disease-no chest pain today.  Denies recent episodes of chest discomfort.  She was noted to have mild coronary calcification on CT.  Marland Kitchen   Continue Repatha, ezetimibe Heart healthy low-sodium high-fiber diet CBC  Hyperlipidemia-LDL144 on 07/14/21.  Total cholesterol 207, LDL 144, HDL 40 on 07/14/2021 Continue high-fiber diet Continue Repatha, ezetimibe Maintain physical activity Repeat fasting lipids and  LFTs  Essential hypertension-BP today122/80 Maintain blood pressure log Low-sodium diet BMP  Tobacco abuse-pack every to to three days  Cesation information give    Disposition: Follow-up with Dr. Swaziland or me in 3-4 months.      Medication Adjustments/Labs and Tests Ordered: Current medicines are reviewed at length with the patient today.  Concerns regarding medicines are outlined above.  No orders of the defined types were placed in this encounter.   No orders of the defined types were placed in this encounter.    There are no Patient Instructions on file for this visit.   Signed, Ronney Asters, NP  01/18/2023 3:51 PM      Notice: This dictation was prepared with Dragon dictation along with smaller phrase technology. Any transcriptional errors that result from this process are unintentional and may not be corrected upon review.  I spent 13 minutes examining this patient, reviewing medications, and using patient centered shared decision making involving her cardiac care.  Prior to her visit I spent greater than 20 minutes reviewing her past medical history,  medications, and prior cardiac tests.

## 2023-01-16 ENCOUNTER — Other Ambulatory Visit: Payer: Self-pay

## 2023-01-16 DIAGNOSIS — M5412 Radiculopathy, cervical region: Secondary | ICD-10-CM | POA: Diagnosis not present

## 2023-01-18 ENCOUNTER — Ambulatory Visit: Payer: 59 | Attending: General Practice | Admitting: General Practice

## 2023-01-18 ENCOUNTER — Encounter: Payer: Self-pay | Admitting: General Practice

## 2023-01-18 VITALS — BP 122/80 | HR 104 | Ht 64.0 in | Wt 147.6 lb

## 2023-01-18 DIAGNOSIS — I10 Essential (primary) hypertension: Secondary | ICD-10-CM | POA: Diagnosis not present

## 2023-01-18 DIAGNOSIS — Z720 Tobacco use: Secondary | ICD-10-CM

## 2023-01-18 DIAGNOSIS — I2510 Atherosclerotic heart disease of native coronary artery without angina pectoris: Secondary | ICD-10-CM

## 2023-01-18 DIAGNOSIS — E782 Mixed hyperlipidemia: Secondary | ICD-10-CM | POA: Diagnosis not present

## 2023-01-18 NOTE — Patient Instructions (Addendum)
Medication Instructions:   No changes  *If you need a refill on your cardiac medications before your next appointment, please call your pharmacy*   Lab Work: Cbc Cmp lipid If you have labs (blood work) drawn today and your tests are completely normal, you will receive your results only by: MyChart Message (if you have MyChart) OR A paper copy in the mail If you have any lab test that is abnormal or we need to change your treatment, we will call you to review the results.   Testing/Procedures:  Not needed  Follow-Up: At [facility], you and your health needs are our priority.  As part of our continuing mission to provide you with exceptional heart care, we have created designated Provider Care Teams.  These Care Teams include your primary Cardiologist (physician) and Advanced Practice Providers (APPs -  Physician Assistants and Nurse Practitioners) who all work together to provide you with the care you need, when you need it.     Your next appointment:    3 to 4 month(s)  The format for your next appointment:   In Person  Provider:   Peter Swaziland, MD  or Edd Fabian, FNP       Other Instructions     Increase Hydrate   Decrease caffeine  Your physician discussed the hazards of tobacco use. Tobacco use cessation is recommended and techniques and options to help you quit were discussed.    Watch mindful stress reduction

## 2023-01-23 DIAGNOSIS — M5412 Radiculopathy, cervical region: Secondary | ICD-10-CM | POA: Diagnosis not present

## 2023-01-29 ENCOUNTER — Other Ambulatory Visit (HOSPITAL_BASED_OUTPATIENT_CLINIC_OR_DEPARTMENT_OTHER): Payer: Self-pay

## 2023-01-30 ENCOUNTER — Other Ambulatory Visit (HOSPITAL_BASED_OUTPATIENT_CLINIC_OR_DEPARTMENT_OTHER): Payer: Self-pay

## 2023-01-30 MED ORDER — CYCLOBENZAPRINE HCL 10 MG PO TABS
10.0000 mg | ORAL_TABLET | Freq: Three times a day (TID) | ORAL | 0 refills | Status: DC
  Filled 2023-01-30: qty 30, 10d supply, fill #0

## 2023-02-01 ENCOUNTER — Other Ambulatory Visit (HOSPITAL_BASED_OUTPATIENT_CLINIC_OR_DEPARTMENT_OTHER): Payer: Self-pay

## 2023-02-06 DIAGNOSIS — M5412 Radiculopathy, cervical region: Secondary | ICD-10-CM | POA: Diagnosis not present

## 2023-02-11 DIAGNOSIS — M5412 Radiculopathy, cervical region: Secondary | ICD-10-CM | POA: Diagnosis not present

## 2023-02-19 DIAGNOSIS — M5412 Radiculopathy, cervical region: Secondary | ICD-10-CM | POA: Diagnosis not present

## 2023-02-23 ENCOUNTER — Other Ambulatory Visit (HOSPITAL_BASED_OUTPATIENT_CLINIC_OR_DEPARTMENT_OTHER): Payer: Self-pay

## 2023-02-25 DIAGNOSIS — M5412 Radiculopathy, cervical region: Secondary | ICD-10-CM | POA: Diagnosis not present

## 2023-02-26 ENCOUNTER — Other Ambulatory Visit (HOSPITAL_COMMUNITY): Payer: Self-pay | Admitting: Family Medicine

## 2023-02-26 DIAGNOSIS — M542 Cervicalgia: Secondary | ICD-10-CM

## 2023-02-27 ENCOUNTER — Ambulatory Visit (HOSPITAL_COMMUNITY)
Admission: RE | Admit: 2023-02-27 | Discharge: 2023-02-27 | Disposition: A | Discharge status: Home / Self Care | Payer: 59 | Source: Ambulatory Visit | Attending: Family Medicine | Admitting: Family Medicine

## 2023-02-27 DIAGNOSIS — M4802 Spinal stenosis, cervical region: Secondary | ICD-10-CM | POA: Diagnosis not present

## 2023-02-27 DIAGNOSIS — M542 Cervicalgia: Secondary | ICD-10-CM | POA: Diagnosis not present

## 2023-02-27 DIAGNOSIS — M47812 Spondylosis without myelopathy or radiculopathy, cervical region: Secondary | ICD-10-CM | POA: Diagnosis not present

## 2023-02-27 DIAGNOSIS — M50222 Other cervical disc displacement at C5-C6 level: Secondary | ICD-10-CM | POA: Diagnosis not present

## 2023-02-27 DIAGNOSIS — M50221 Other cervical disc displacement at C4-C5 level: Secondary | ICD-10-CM | POA: Diagnosis not present

## 2023-02-28 ENCOUNTER — Ambulatory Visit (HOSPITAL_COMMUNITY): Payer: 59

## 2023-03-09 ENCOUNTER — Other Ambulatory Visit (HOSPITAL_BASED_OUTPATIENT_CLINIC_OR_DEPARTMENT_OTHER): Payer: Self-pay

## 2023-03-11 ENCOUNTER — Other Ambulatory Visit: Payer: Self-pay

## 2023-03-13 DIAGNOSIS — M5412 Radiculopathy, cervical region: Secondary | ICD-10-CM | POA: Diagnosis not present

## 2023-03-14 ENCOUNTER — Other Ambulatory Visit (HOSPITAL_BASED_OUTPATIENT_CLINIC_OR_DEPARTMENT_OTHER): Payer: Self-pay

## 2023-03-15 ENCOUNTER — Other Ambulatory Visit (HOSPITAL_BASED_OUTPATIENT_CLINIC_OR_DEPARTMENT_OTHER): Payer: Self-pay

## 2023-03-21 DIAGNOSIS — M5412 Radiculopathy, cervical region: Secondary | ICD-10-CM | POA: Diagnosis not present

## 2023-03-21 DIAGNOSIS — M542 Cervicalgia: Secondary | ICD-10-CM | POA: Diagnosis not present

## 2023-04-13 DIAGNOSIS — M5412 Radiculopathy, cervical region: Secondary | ICD-10-CM | POA: Diagnosis not present

## 2023-04-23 ENCOUNTER — Ambulatory Visit: Payer: 59 | Admitting: General Practice

## 2023-04-26 NOTE — Progress Notes (Unsigned)
Cardiology Office Note:    Date:  05/01/2023   ID:  Kathleen Luna, DOB 1965-09-30, MRN [id number]  PCP:  Tollie Eth, NP   [facility] Providers Cardiologist:  Peter Swaziland, MD      Referring MD: Tollie Eth, NP   Presents to clinic for an evaluation of her chest pressure  History of Present Illness:    Kathleen Luna is a 56 y.o. female with a hx of abnormal EKG with normal stress test, essential hypertension, and tobacco abuse.  She was seen by Dr. Swaziland on 06/09/2014 for evaluation after she had an abnormal EKG and left arm pain.  Her EKG suggested old septal infarct.  Her risk factors included hypertension and tobacco abuse as well as family history for coronary artery disease.  Her left arm pain resolved.  She underwent stress testing on 05/19/2014.  Her nuclear stress test showed low risk with mild breast attenuation artifact.  She was not noted to have ST or T wave changes.  She presented to the clinic 04/18/21 for evaluation of chest pressure.  She stated she felt well.  She  had no further episodes of chest discomfort.  We reviewed her lab work from the emergency department and her chest CT.  She expressed understanding.  She did report occasional brief episodes of palpitations.  We discussed avoiding triggers for palpitations.  We reviewed her lipid panel and she reported that she had been off statin therapy for about 6 months.  She did note myalgias with the medication.  I  refered her to the lipid clinic. Follow-up with Dr. Swaziland in 4-6 months was planned.  She was seen in follow-up by Dr. Swaziland 09/15/2021.  She had been started on Repatha and her LDL improved from 254 down to 144.  Zetia was added to her medication regimen and November.  She continued to try to quit smoking.  She was planning to start Chantix.  She denied chest pain.  Her blood pressure was well-controlled.  Follow-up was planned for 1 year.  She presented to the clinic 01/18/23 for follow-up  evaluation and stated she felt fairly well.  Her biggest complaint was with neck pain.  She reported that she was seeing [facility] for treatment of a pinched nerve in her neck.  Her EKG showed sinus tachycardia septal infarct undetermined age 25 bpm.  Her blood pressure was 122/80.  She reported increased stress with a recent close family friend passing away.  We discussed the importance of limiting caffeine, increasing p.o. hydration, and increasing physical activity.  I  ordered CBC, BMP, fasting lipids and LFTs.  Follow-up in 3 to 4 months to evaluate heart rate was planned.   She presents to the clinic today for follow-up evaluation and states she had a nerve block performed by orthopedics for her neck.  She was also started on gabapentin.  She reports that she continues to have right arm numbness and tingling.  There are plans to do a 2nd nerve block on her greater occipital nerve in a few weeks.  She reports that she did smoke prior to her visit today.  Her heart rate today is 101.  She reports that she has not had her thyroid checked recently.  She has been walking regularly 30 minutes in the evenings, continues to maintain her hydration and has cut back on her caffeine intake.  I will order CBC, BMP, thyroid panel, refill her Repatha and plan follow-up in 4-6 months.Marland Kitchen  Today she denies chest pain, shortness of breath, lower extremity edema, fatigue, melena, hematuria, hemoptysis, weakness, presyncope, and syncope.     Past Medical History:  Diagnosis Date   Arthropathy of lumbar facet joint 08/26/2018   Closed fracture of nasal bone with routine healing 05/26/2018   Headache(784.0)    otc med prn   HTN (hypertension) 05/11/2014   Hyperlipidemia    Hypothyroidism    Hypothyroidism, adult 06/02/2019   Primary ovarian failure 06/02/2019   Smoking 05/11/2014   Somatic dysfunction of right sacroiliac joint 08/11/2018   SVD (spontaneous vaginal delivery)    x 1   Tear of right  acetabular labrum 12/03/2019   Vitamin D deficiency disease 06/02/2019    Past Surgical History:  Procedure Laterality Date   BREAST SURGERY  09/2010   augmentation   VULVAR LESION REMOVAL  01/17/2012   Procedure: VULVAR LESION;  Surgeon: Leslie Andrea, MD;  Location: [facility];  Service: Gynecology;  Laterality: N/A;  incision and drainage  of perineal abscess   WISDOM TOOTH EXTRACTION      Current Medications: Current Meds  Medication Sig   Diethylpropion HCl CR 75 MG TB24 Take 1 tablet (75 mg total) by mouth daily.   estradiol (ESTRACE) 2 MG tablet Take 1 tablet (2 mg total) by mouth daily.   ezetimibe (ZETIA) 10 MG tablet Take 1 tablet (10 mg total) by mouth daily.   gabapentin (NEURONTIN) 300 MG capsule Take 1 capsule (300 mg total) by mouth at bedtime.   progesterone (PROMETRIUM) 200 MG capsule TAKE 2 CAPSULES (400 MG TOTAL) BY MOUTH DAILY.   TESTOSTERONE CYPIONATE IM Inject 10 mg into the muscle 2 (two) times a week. COMPOUNDED   [DISCONTINUED] Evolocumab (REPATHA SURECLICK) 140 MG/ML SOAJ Inject 140 mg into the skin every 14 (fourteen) days.     Allergies:   Crestor [rosuvastatin], Lipitor [atorvastatin], Oxycodone, and Hydrocodone   Social History   Socioeconomic History   Marital status: Single    Spouse name: Not on file   Number of children: Not on file   Years of education: Not on file   Highest education level: Not on file  Occupational History   Not on file  Tobacco Use   Smoking status: Former    Current packs/day: 0.00    Average packs/day: 0.5 packs/day for 15.0 years (7.5 ttl pk-yrs)    Types: Cigarettes    Start date: 04/17/2006    Quit date: 04/17/2021    Years since quitting: 2.0   Smokeless tobacco: Never  Vaping Use   Vaping status: Former  Substance and Sexual Activity   Alcohol use: Yes    Alcohol/week: 1.0 standard drink of alcohol    Types: 1 Glasses of wine per week    Comment: weekends   Drug use: No   Sexual activity: Yes    Birth  control/protection: I.U.D.  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   Social Determinants of Health   Financial Resource Strain: Not on file  Food Insecurity: Not on file  Transportation Needs: Not on file  Physical Activity: Not on file  Stress: Not on file  Social Connections: Not on file     Family History: The patient's family history includes Cancer in her brother and sister; Dementia in her mother; Early death in her father; Heart attack in her father; Heart disease in her  sister; Hyperlipidemia in her daughter and mother; Hypertension in her mother.  ROS:   Please see the history of present illness.     All other systems reviewed and are negative.   Risk Assessment/Calculations:           Physical Exam:    VS:  BP 124/70 (BP Location: Left Arm, Patient Position: Sitting, Cuff Size: Normal)   Pulse (!) 101   Ht 5\' 4"  (1.626 m)   Wt 143 lb 12.8 oz (65.2 kg)   SpO2 97%   BMI 24.68 kg/m     Wt Readings from Last 3 Encounters:  05/01/23 143 lb 12.8 oz (65.2 kg)  01/18/23 147 lb 9.6 oz (67 kg)  09/15/21 141 lb (64 kg)     GEN:  Well nourished, well developed in no acute distress HEENT: Normal NECK: No JVD; No carotid bruits LYMPHATICS: No lymphadenopathy CARDIAC: RRR, no murmurs, rubs, gallops RESPIRATORY:  Clear to auscultation without rales, wheezing or rhonchi  ABDOMEN: Soft, non-tender, non-distended MUSCULOSKELETAL:  No edema; No deformity  SKIN: Warm and dry NEUROLOGIC:  Alert and oriented x 3 PSYCHIATRIC:  Normal affect    EKGs/Labs/Other Studies Reviewed:    The following studies were reviewed today:  Nuclear stress test 05/19/2014 Indication for Stress Test: Evaluation for Ischemia and Abnormal EKG  History: no prior nuclear MPI  Cardiac Risk Factors: Family History - CAD, Hypertension, Lipids and Smoker   Symptoms: SOB and Left arm pain  Nuclear Pre-Procedure  Caffeine/Decaff Intake: 7:00pm  NPO After: 5:00am   IV Site: R Antecubital  IV 0.9% NS with Angio Cath: 22g   Chest Size (in): n/a  IV Started by: Koren Shiver, CNMT   Height: 5\' 4"  (1.626 m)  Cup Size: 36D Bil Implants   BMI: Body mass index is 23.68 kg/(m^2).  Weight: 138 lb (62.596 kg)     Tech Comments: Bilateral breast implants   Nuclear Med Study  1 or 2 day study: 1 day  Stress Test Type: Stress   Order Authorizing Provider: Peter Swaziland, MD     Resting Radionuclide: Technetium 58m Sestamibi  Resting Radionuclide Dose: 10.3 mCi   Stress Radionuclide: Technetium 46m Sestamibi  Stress Radionuclide Dose: 29.8 mCi   Stress Protocol  Rest HR: 74  Stress HR:148   Rest BP: 150/99  Stress BP: 177/98   Exercise Time (min): 10:02  METS: 10.80   Predicted Max HR: 170 bpm  % Max HR: 87.06 bpm  Rate Pressure Product: 16109  Dose of Adenosine (mg): n/a  Dose of Lexiscan: n/a mg   Dose of Atropine (mg): n/a  Dose of Dobutamine: n/a mcg/kg/min (at max HR)   Stress Test Technologist: Ernestene Mention, CCT  Nuclear Technologist: Gonzella Lex, CNMT   Rest Procedure: Myocardial perfusion imaging was performed at rest 45 minutes following the intravenous administration of Technetium 29m Sestamibi.  Stress Procedure: The patient performed treadmill exercise using a Bruce Protocol for 10 minutes 2 seconds. The patient stopped due to extreme head pain and denied any chest pain. There were no significant ST-T wave changes. Technetium 58m Sestamibi was injected IV at peak exercise and myocardial perfusion imaging was performed after a brief delay.  Transient Ischemic Dilatation (Normal <1.22): 1.01  QGS EDV: 85 ml  QGS ESV: 30 ml  LV Ejection Fraction: 64%  Rest ECG: NSR - Normal EKG  Stress ECG: No significant change from baseline ECG  QPS  Raw Data Images: There is a breast shadow that accounts for the  anterior attenuation.  Stress Images:  Normal homogeneous uptake in all areas of the myocardium.  Rest Images: Comparison with the stress images reveals no significant change.  Subtraction (SDS): No evidence of ischemia.  LV Wall Motion: NL LV Function; NL Wall Motion  Impression  Exercise Capacity: Good exercise capacity.  BP Response: Hypertensive blood pressure response.  Clinical Symptoms: No significant symptoms noted.  ECG Impression: No significant ST segment change suggestive of ischemia.  Comparison with Prior Nuclear Study: No previous nuclear study performed  Overall Impression: Low risk stress nuclear study with mild breast attenuation artifact.Thurmon Fair, MD  05/19/2014 10:45 AM   EKG: None today.   EKG 01/18/2023.   Sinus tachycardia septal infarct undetermined age 35 BPM.  EKG 823/22  The ekg ordered today demonstrates normal sinus rhythm septal infarct undetermined age 56 bpm  Recent Labs: No results found for requested labs within last 365 days.  Recent Lipid Panel    Component Value Date/Time   CHOL 207 (H) 07/14/2021 0826   TRIG 128 07/14/2021 0826   HDL 40 07/14/2021 0826   CHOLHDL 5.2 (H) 07/14/2021 0826   CHOLHDL 6.3 (H) 01/25/2021 1330   LDLCALC 144 (H) 07/14/2021 0826   LDLCALC 254 (H) 01/25/2021 1330    ASSESSMENT & PLAN   Sinus tachycardia-heart rate today 101.  Denies lightheadedness, presyncope and syncope.  Has not had thyroid panel checked in quite some time Avoid triggers Monitor pulse Maintain physical activity as tolerated Maintain p.o. hydration CBC, BMP, tsh, t3, t4  Coronary artery disease-denies chest pain.  She was previously noted to have mild coronary calcification on CT.    Continue Repatha, ezetimibe Heart healthy low-sodium high-fiber diet-reviewed   Hyperlipidemia- Total cholesterol 207, LDL 144, HDL 40 on 07/14/2021 . Continue high-fiber diet Continue Repatha, ezetimibe Maintain physical activity Repeat fasting lipids and LFTs  Essential  hypertension-BP today 124/70 Maintain blood pressure log Low-sodium diet   Disposition: Follow-up with Dr. Swaziland or me in 4-6 months.      Medication Adjustments/Labs and Tests Ordered: Current medicines are reviewed at length with the patient today.  Concerns regarding medicines are outlined above.  Orders Placed This Encounter  Procedures   CBC   Basic metabolic panel   TSH   Lipid panel   Hepatic function panel    Meds ordered this encounter  Medications   Evolocumab (REPATHA SURECLICK) 140 MG/ML SOAJ    Sig: Inject 140 mg into the skin every 14 (fourteen) days.    Dispense:  6 mL    Refill:  1     Patient Instructions  Medication Instructions:  The current medical regimen is effective;  continue present plan and medications as directed. Please refer to the Current Medication list given to you today.  *If you need a refill on your cardiac medications before your next appointment, please call your pharmacy*  Lab Work: CBC.BMET,TSH, LFT AND LIPID TODAY If you have labs (blood work) drawn today and your tests are completely normal, you will receive your results only by:  MyChart Message (if you have MyChart) OR  A paper copy in the mail If you have any lab test that is abnormal or we need to change your treatment, we will call you to review the results.  Other Instructions MAINTAIN HYDRATION CONTINUE YOUR PHYSICAL ACTIVITY  Follow-Up: At [facility], you and your health needs are our priority.  As part of our continuing mission to provide you with exceptional heart care, we have  created designated Provider Care Teams.  These Care Teams include your primary Cardiologist (physician) and Advanced Practice Providers (APPs -  Physician Assistants and Nurse Practitioners) who all work together to provide you with the care you need, when you need it.  Your next appointment:   4-6 month(s)  Provider:   Peter Swaziland, MD  or Edd Fabian, FNP            Signed, Ronney Asters, NP  05/01/2023 8:30 AM      Notice: This dictation was prepared with Dragon dictation along with smaller phrase technology. Any transcriptional errors that result from this process are unintentional and may not be corrected upon review.  I spent 13 minutes examining this patient, reviewing medications, and using patient centered shared decision making involving her cardiac care.  Prior to her visit I spent greater than 20 minutes reviewing her past medical history,  medications, and prior cardiac tests.

## 2023-04-30 ENCOUNTER — Other Ambulatory Visit (HOSPITAL_BASED_OUTPATIENT_CLINIC_OR_DEPARTMENT_OTHER): Payer: Self-pay

## 2023-04-30 DIAGNOSIS — M542 Cervicalgia: Secondary | ICD-10-CM | POA: Diagnosis not present

## 2023-04-30 MED ORDER — GABAPENTIN 300 MG PO CAPS
300.0000 mg | ORAL_CAPSULE | Freq: Every day | ORAL | 0 refills | Status: DC
  Filled 2023-04-30: qty 30, 30d supply, fill #0

## 2023-05-01 ENCOUNTER — Ambulatory Visit: Payer: 59 | Attending: General Practice | Admitting: General Practice

## 2023-05-01 ENCOUNTER — Encounter: Payer: Self-pay | Admitting: General Practice

## 2023-05-01 ENCOUNTER — Other Ambulatory Visit (HOSPITAL_BASED_OUTPATIENT_CLINIC_OR_DEPARTMENT_OTHER): Payer: Self-pay

## 2023-05-01 VITALS — BP 124/70 | HR 101 | Ht 64.0 in | Wt 143.8 lb

## 2023-05-01 DIAGNOSIS — I10 Essential (primary) hypertension: Secondary | ICD-10-CM

## 2023-05-01 DIAGNOSIS — E782 Mixed hyperlipidemia: Secondary | ICD-10-CM | POA: Diagnosis not present

## 2023-05-01 DIAGNOSIS — R000 Tachycardia, unspecified: Secondary | ICD-10-CM

## 2023-05-01 DIAGNOSIS — I2510 Atherosclerotic heart disease of native coronary artery without angina pectoris: Secondary | ICD-10-CM

## 2023-05-01 MED ORDER — REPATHA SURECLICK 140 MG/ML ~~LOC~~ SOAJ
140.0000 mg | SUBCUTANEOUS | 1 refills | Status: DC
  Filled 2023-05-01 – 2023-08-10 (×4): qty 6, 84d supply, fill #0
  Filled ????-??-??: fill #0

## 2023-05-01 NOTE — Patient Instructions (Signed)
Medication Instructions:  The current medical regimen is effective;  continue present plan and medications as directed. Please refer to the Current Medication list given to you today.  *If you need a refill on your cardiac medications before your next appointment, please call your pharmacy*  Lab Work: CBC.BMET,TSH, LFT AND LIPID TODAY If you have labs (blood work) drawn today and your tests are completely normal, you will receive your results only by:  MyChart Message (if you have MyChart) OR  A paper copy in the mail If you have any lab test that is abnormal or we need to change your treatment, we will call you to review the results.  Other Instructions MAINTAIN HYDRATION CONTINUE YOUR PHYSICAL ACTIVITY  Follow-Up: At [facility], you and your health needs are our priority.  As part of our continuing mission to provide you with exceptional heart care, we have created designated Provider Care Teams.  These Care Teams include your primary Cardiologist (physician) and Advanced Practice Providers (APPs -  Physician Assistants and Nurse Practitioners) who all work together to provide you with the care you need, when you need it.  Your next appointment:   4-6 month(s)  Provider:   Peter Swaziland, MD  or Edd Fabian, FNP

## 2023-05-02 ENCOUNTER — Other Ambulatory Visit (HOSPITAL_BASED_OUTPATIENT_CLINIC_OR_DEPARTMENT_OTHER): Payer: Self-pay

## 2023-05-02 LAB — CBC
Hematocrit: 36.6 % (ref 34.0–46.6)
Hemoglobin: 11.9 g/dL (ref 11.1–15.9)
MCH: 29 pg (ref 26.6–33.0)
MCHC: 32.5 g/dL (ref 31.5–35.7)
MCV: 89 fL (ref 79–97)
Platelets: 301 10*3/uL (ref 150–450)
RBC: 4.11 x10E6/uL (ref 3.77–5.28)
RDW: 13.5 % (ref 11.7–15.4)
WBC: 7.9 10*3/uL (ref 3.4–10.8)

## 2023-05-02 LAB — HEPATIC FUNCTION PANEL
ALT: 17 IU/L (ref 0–32)
AST: 19 IU/L (ref 0–40)
Albumin: 4 g/dL (ref 3.8–4.9)
Alkaline Phosphatase: 95 IU/L (ref 44–121)
Bilirubin Total: 0.5 mg/dL (ref 0.0–1.2)
Bilirubin, Direct: 0.13 mg/dL (ref 0.00–0.40)
Total Protein: 6.5 g/dL (ref 6.0–8.5)

## 2023-05-02 LAB — BASIC METABOLIC PANEL
BUN/Creatinine Ratio: 22 (ref 9–23)
BUN: 12 mg/dL (ref 6–24)
CO2: 27 mmol/L (ref 20–29)
Calcium: 9.7 mg/dL (ref 8.7–10.2)
Chloride: 105 mmol/L (ref 96–106)
Creatinine, Ser: 0.55 mg/dL — ABNORMAL LOW (ref 0.57–1.00)
Glucose: 90 mg/dL (ref 70–99)
Potassium: 4.9 mmol/L (ref 3.5–5.2)
Sodium: 140 mmol/L (ref 134–144)
eGFR: 106 mL/min/{1.73_m2} (ref >59)

## 2023-05-02 LAB — LIPID PANEL
Chol/HDL Ratio: 4.1 ratio (ref 0.0–4.4)
Cholesterol, Total: 169 mg/dL (ref 100–199)
HDL: 41 mg/dL (ref >39)
LDL Chol Calc (NIH): 112 mg/dL — ABNORMAL HIGH (ref 0–99)
Triglycerides: 86 mg/dL (ref 0–149)
VLDL Cholesterol Cal: 16 mg/dL (ref 5–40)

## 2023-05-02 LAB — TSH: TSH: <0.005 u[IU]/mL — ABNORMAL LOW (ref 0.450–4.500)

## 2023-05-03 NOTE — Telephone Encounter (Signed)
Forwarded to PCP via EPIC function

## 2023-05-06 ENCOUNTER — Other Ambulatory Visit (HOSPITAL_BASED_OUTPATIENT_CLINIC_OR_DEPARTMENT_OTHER): Payer: Self-pay

## 2023-05-06 ENCOUNTER — Other Ambulatory Visit: Payer: Self-pay

## 2023-05-06 MED ORDER — DIETHYLPROPION HCL ER 75 MG PO TB24
1.0000 | ORAL_TABLET | Freq: Every day | ORAL | 3 refills | Status: DC
  Filled 2023-05-06: qty 30, 30d supply, fill #0
  Filled 2023-06-25: qty 30, 30d supply, fill #1
  Filled 2023-07-03: qty 30, 30d supply, fill #0
  Filled 2023-08-15: qty 30, 30d supply, fill #1
  Filled 2023-10-03: qty 30, 30d supply, fill #2

## 2023-05-08 ENCOUNTER — Other Ambulatory Visit (HOSPITAL_BASED_OUTPATIENT_CLINIC_OR_DEPARTMENT_OTHER): Payer: Self-pay

## 2023-05-08 MED ORDER — THYROID 90 MG PO TABS
90.0000 mg | ORAL_TABLET | Freq: Every day | ORAL | 3 refills | Status: DC
Start: 2023-05-08 — End: 2023-12-06
  Filled 2023-05-08 – 2023-10-23 (×4): qty 90, 90d supply, fill #0

## 2023-05-09 ENCOUNTER — Other Ambulatory Visit (HOSPITAL_BASED_OUTPATIENT_CLINIC_OR_DEPARTMENT_OTHER): Payer: Self-pay

## 2023-05-09 MED ORDER — THYROID 90 MG PO TABS
90.0000 mg | ORAL_TABLET | Freq: Every day | ORAL | 0 refills | Status: DC
Start: 2023-05-09 — End: 2023-11-12
  Filled 2023-07-03: qty 90, 90d supply, fill #0
  Filled 2023-08-10: qty 10, 10d supply, fill #0
  Filled 2023-08-15: qty 80, 80d supply, fill #1
  Filled 2023-08-19: qty 10, 10d supply, fill #1
  Filled 2023-08-20: qty 80, 80d supply, fill #1
  Filled ????-??-??: fill #1
  Filled ????-??-??: fill #0

## 2023-05-10 ENCOUNTER — Other Ambulatory Visit (HOSPITAL_BASED_OUTPATIENT_CLINIC_OR_DEPARTMENT_OTHER): Payer: Self-pay

## 2023-05-11 ENCOUNTER — Other Ambulatory Visit (HOSPITAL_BASED_OUTPATIENT_CLINIC_OR_DEPARTMENT_OTHER): Payer: Self-pay

## 2023-05-27 ENCOUNTER — Other Ambulatory Visit (HOSPITAL_BASED_OUTPATIENT_CLINIC_OR_DEPARTMENT_OTHER): Payer: Self-pay

## 2023-05-27 MED ORDER — GABAPENTIN 300 MG PO CAPS
300.0000 mg | ORAL_CAPSULE | Freq: Every day | ORAL | 0 refills | Status: AC
  Filled 2023-05-27: qty 30, 30d supply, fill #0

## 2023-06-25 ENCOUNTER — Other Ambulatory Visit (HOSPITAL_BASED_OUTPATIENT_CLINIC_OR_DEPARTMENT_OTHER): Payer: Self-pay | Admitting: Nurse Practitioner

## 2023-06-25 ENCOUNTER — Other Ambulatory Visit: Payer: Self-pay

## 2023-06-25 DIAGNOSIS — E039 Hypothyroidism, unspecified: Secondary | ICD-10-CM

## 2023-06-27 ENCOUNTER — Other Ambulatory Visit (HOSPITAL_BASED_OUTPATIENT_CLINIC_OR_DEPARTMENT_OTHER): Payer: Self-pay

## 2023-07-02 ENCOUNTER — Encounter (HOSPITAL_BASED_OUTPATIENT_CLINIC_OR_DEPARTMENT_OTHER): Payer: Self-pay

## 2023-07-03 ENCOUNTER — Other Ambulatory Visit (HOSPITAL_BASED_OUTPATIENT_CLINIC_OR_DEPARTMENT_OTHER): Payer: Self-pay

## 2023-07-03 ENCOUNTER — Other Ambulatory Visit (HOSPITAL_COMMUNITY): Payer: Self-pay

## 2023-07-04 ENCOUNTER — Other Ambulatory Visit (HOSPITAL_COMMUNITY): Payer: Self-pay

## 2023-07-05 ENCOUNTER — Other Ambulatory Visit (HOSPITAL_COMMUNITY): Payer: Self-pay

## 2023-07-05 ENCOUNTER — Other Ambulatory Visit: Payer: Self-pay

## 2023-07-05 MED ORDER — PROGESTERONE 200 MG PO CAPS
400.0000 mg | ORAL_CAPSULE | Freq: Every evening | ORAL | 0 refills | Status: DC
  Filled 2023-07-05: qty 180, 90d supply, fill #0

## 2023-07-06 ENCOUNTER — Other Ambulatory Visit (HOSPITAL_COMMUNITY): Payer: Self-pay

## 2023-07-10 ENCOUNTER — Other Ambulatory Visit (HOSPITAL_BASED_OUTPATIENT_CLINIC_OR_DEPARTMENT_OTHER): Payer: Self-pay

## 2023-07-10 MED ORDER — GABAPENTIN 300 MG PO CAPS
300.0000 mg | ORAL_CAPSULE | Freq: Every day | ORAL | 0 refills | Status: DC
  Filled 2023-07-10 – 2023-08-10 (×2): qty 30, 30d supply, fill #0
  Filled ????-??-??: fill #0

## 2023-07-22 ENCOUNTER — Other Ambulatory Visit (HOSPITAL_BASED_OUTPATIENT_CLINIC_OR_DEPARTMENT_OTHER): Payer: Self-pay

## 2023-08-05 ENCOUNTER — Other Ambulatory Visit (HOSPITAL_BASED_OUTPATIENT_CLINIC_OR_DEPARTMENT_OTHER): Payer: Self-pay

## 2023-08-05 MED ORDER — AMOXICILLIN 500 MG PO CAPS
500.0000 mg | ORAL_CAPSULE | Freq: Two times a day (BID) | ORAL | 0 refills | Status: AC
  Filled 2023-08-05: qty 20, 10d supply, fill #0

## 2023-08-10 ENCOUNTER — Other Ambulatory Visit (HOSPITAL_COMMUNITY): Payer: Self-pay

## 2023-08-10 ENCOUNTER — Other Ambulatory Visit (HOSPITAL_BASED_OUTPATIENT_CLINIC_OR_DEPARTMENT_OTHER): Payer: Self-pay

## 2023-08-10 ENCOUNTER — Other Ambulatory Visit: Payer: Self-pay | Admitting: Cardiology

## 2023-08-12 ENCOUNTER — Other Ambulatory Visit: Payer: Self-pay

## 2023-08-12 ENCOUNTER — Other Ambulatory Visit (HOSPITAL_COMMUNITY): Payer: Self-pay

## 2023-08-12 DIAGNOSIS — H5203 Hypermetropia, bilateral: Secondary | ICD-10-CM | POA: Diagnosis not present

## 2023-08-12 DIAGNOSIS — H52203 Unspecified astigmatism, bilateral: Secondary | ICD-10-CM | POA: Diagnosis not present

## 2023-08-12 MED ORDER — EZETIMIBE 10 MG PO TABS
10.0000 mg | ORAL_TABLET | Freq: Every day | ORAL | 2 refills | Status: AC
  Filled 2023-08-12: qty 90, 90d supply, fill #0
  Filled 2023-12-11 – 2024-01-02 (×3): qty 90, 90d supply, fill #1
  Filled 2024-06-02: qty 90, 90d supply, fill #2
  Filled 2024-06-20: qty 90, 90d supply, fill #0

## 2023-08-15 ENCOUNTER — Other Ambulatory Visit (HOSPITAL_COMMUNITY): Payer: Self-pay

## 2023-08-15 ENCOUNTER — Other Ambulatory Visit: Payer: Self-pay

## 2023-08-15 ENCOUNTER — Other Ambulatory Visit (HOSPITAL_BASED_OUTPATIENT_CLINIC_OR_DEPARTMENT_OTHER): Payer: Self-pay

## 2023-08-17 ENCOUNTER — Other Ambulatory Visit (HOSPITAL_BASED_OUTPATIENT_CLINIC_OR_DEPARTMENT_OTHER): Payer: Self-pay

## 2023-08-17 ENCOUNTER — Other Ambulatory Visit (HOSPITAL_COMMUNITY): Payer: Self-pay

## 2023-08-19 ENCOUNTER — Other Ambulatory Visit (HOSPITAL_COMMUNITY): Payer: Self-pay

## 2023-08-20 ENCOUNTER — Encounter (HOSPITAL_COMMUNITY): Payer: Self-pay

## 2023-08-20 ENCOUNTER — Other Ambulatory Visit (HOSPITAL_COMMUNITY): Payer: Self-pay

## 2023-08-20 ENCOUNTER — Other Ambulatory Visit: Payer: Self-pay

## 2023-08-20 ENCOUNTER — Other Ambulatory Visit (HOSPITAL_BASED_OUTPATIENT_CLINIC_OR_DEPARTMENT_OTHER): Payer: Self-pay

## 2023-08-29 ENCOUNTER — Telehealth: Payer: 59 | Admitting: Family Medicine

## 2023-08-29 ENCOUNTER — Other Ambulatory Visit (HOSPITAL_COMMUNITY): Payer: Self-pay

## 2023-08-29 DIAGNOSIS — B379 Candidiasis, unspecified: Secondary | ICD-10-CM | POA: Diagnosis not present

## 2023-08-29 DIAGNOSIS — J019 Acute sinusitis, unspecified: Secondary | ICD-10-CM

## 2023-08-29 DIAGNOSIS — T3695XA Adverse effect of unspecified systemic antibiotic, initial encounter: Secondary | ICD-10-CM

## 2023-08-29 DIAGNOSIS — B9689 Other specified bacterial agents as the cause of diseases classified elsewhere: Secondary | ICD-10-CM | POA: Diagnosis not present

## 2023-08-29 MED ORDER — DOXYCYCLINE HYCLATE 100 MG PO TABS
100.0000 mg | ORAL_TABLET | Freq: Two times a day (BID) | ORAL | 0 refills | Status: AC
  Filled 2023-08-29: qty 14, 7d supply, fill #0

## 2023-08-29 NOTE — Progress Notes (Signed)
 E-Visit for Sinus Problems  We are sorry that you are not feeling well.  Here is how we plan to help!  Based on what you have shared with me it looks like you have sinusitis.  Sinusitis is inflammation and infection in the sinus cavities of the head.  Based on your presentation I believe you most likely have Acute Bacterial Sinusitis.  This is an infection caused by bacteria and is treated with antibiotics. I have prescribed Doxycyline 100 mg twice daily for 7 days You may use an oral decongestant such as Mucinex  D or if you have glaucoma or high blood pressure use plain Mucinex . Saline nasal spray help and can safely be used as often as needed for congestion.  If you develop worsening sinus pain, fever or notice severe headache and vision changes, or if symptoms are not better after completion of antibiotic, please schedule an appointment with a health care provider.    Sinus infections are not as easily transmitted as other respiratory infection, however we still recommend that you avoid close contact with loved ones, especially the very young and elderly.  Remember to wash your hands thoroughly throughout the day as this is the number one way to prevent the spread of infection!  Home Care: Only take medications as instructed by your medical team. Complete the entire course of an antibiotic. Do not take these medications with alcohol. A steam or ultrasonic humidifier can help congestion.  You can place a towel over your head and breathe in the steam from hot water coming from a faucet. Avoid close contacts especially the very young and the elderly. Cover your mouth when you cough or sneeze. Always remember to wash your hands.  Get Help Right Away If: You develop worsening fever or sinus pain. You develop a severe head ache or visual changes. Your symptoms persist after you have completed your treatment plan.  Make sure you Understand these instructions. Will watch your condition. Will get  help right away if you are not doing well or get worse.  Thank you for choosing an e-visit.  Your e-visit answers were reviewed by a board certified advanced clinical practitioner to complete your personal care plan. Depending upon the condition, your plan could have included both over the counter or prescription medications.  Please review your pharmacy choice. Make sure the pharmacy is open so you can pick up prescription now. If there is a problem, you may contact your provider through [employer] and have the prescription routed to another pharmacy.  Your safety is important to us . If you have drug allergies check your prescription carefully.   For the next 24 hours you can use MyChart to ask questions about today's visit, request a non-urgent call back, or ask for a work or school excuse. You will get an email in the next two days asking about your experience. I hope that your e-visit has been valuable and will speed your recovery.  I provided 5 minutes of non face-to-face time during this encounter for chart review, medication and order placement, as well as and documentation.

## 2023-08-30 ENCOUNTER — Other Ambulatory Visit (HOSPITAL_COMMUNITY): Payer: Self-pay

## 2023-08-30 MED ORDER — FLUCONAZOLE 150 MG PO TABS
150.0000 mg | ORAL_TABLET | ORAL | 0 refills | Status: DC
  Filled 2023-08-30: qty 2, 3d supply, fill #0

## 2023-08-30 NOTE — Addendum Note (Signed)
 Addended by: Freddy Finner on: 08/30/2023 09:04 AM   Modules accepted: Orders

## 2023-09-25 ENCOUNTER — Telehealth: Payer: 59 | Admitting: Physician Assistant

## 2023-09-25 DIAGNOSIS — J329 Chronic sinusitis, unspecified: Secondary | ICD-10-CM

## 2023-09-26 NOTE — Progress Notes (Signed)
  Because of recurrence of sinus symptoms within a month of treatment for sinusitis via e-visit and need for exam, I feel your condition warrants further evaluation and I recommend that you be seen in a face-to-face visit.   NOTE: There will be NO CHARGE for this E-Visit   If you are having a true medical emergency, please call 911.     For an urgent face to face visit, [place] has multiple urgent care centers for your convenience.  Click the link below for the full list of locations and hours, walk-in wait times, appointment scheduling options and driving directions:  Urgent Care - [place], [place], [place], [place], [place], [place]  [place]     Your MyChart E-visit questionnaire answers were reviewed by a board certified advanced clinical practitioner to complete your personal care plan based on your specific symptoms.    Thank you for using e-Visits.

## 2023-09-27 DIAGNOSIS — Z1231 Encounter for screening mammogram for malignant neoplasm of breast: Secondary | ICD-10-CM | POA: Diagnosis not present

## 2023-09-27 DIAGNOSIS — Z01419 Encounter for gynecological examination (general) (routine) without abnormal findings: Secondary | ICD-10-CM | POA: Diagnosis not present

## 2023-09-27 DIAGNOSIS — Z124 Encounter for screening for malignant neoplasm of cervix: Secondary | ICD-10-CM | POA: Diagnosis not present

## 2023-09-27 DIAGNOSIS — Z1239 Encounter for other screening for malignant neoplasm of breast: Secondary | ICD-10-CM | POA: Diagnosis not present

## 2023-09-27 DIAGNOSIS — R319 Hematuria, unspecified: Secondary | ICD-10-CM | POA: Diagnosis not present

## 2023-09-27 DIAGNOSIS — Z6823 Body mass index (BMI) 23.0-23.9, adult: Secondary | ICD-10-CM | POA: Diagnosis not present

## 2023-10-02 ENCOUNTER — Other Ambulatory Visit (HOSPITAL_COMMUNITY): Payer: Self-pay

## 2023-10-03 ENCOUNTER — Other Ambulatory Visit: Payer: Self-pay

## 2023-10-04 ENCOUNTER — Other Ambulatory Visit (HOSPITAL_COMMUNITY): Payer: Self-pay

## 2023-10-07 ENCOUNTER — Other Ambulatory Visit: Payer: Self-pay

## 2023-10-07 ENCOUNTER — Other Ambulatory Visit (HOSPITAL_COMMUNITY): Payer: Self-pay

## 2023-10-07 MED ORDER — PROGESTERONE 200 MG PO CAPS
400.0000 mg | ORAL_CAPSULE | Freq: Every day | ORAL | 3 refills | Status: DC
  Filled 2023-10-07 – 2023-10-23 (×2): qty 180, 90d supply, fill #0

## 2023-10-07 MED ORDER — ESTRADIOL 1 MG PO TABS
1.0000 mg | ORAL_TABLET | Freq: Every day | ORAL | 3 refills | Status: AC
  Filled 2023-10-07 – 2023-10-23 (×2): qty 90, 90d supply, fill #0
  Filled 2024-01-24: qty 90, 90d supply, fill #1
  Filled 2024-05-18: qty 90, 90d supply, fill #2
  Filled 2024-05-22: qty 90, 90d supply, fill #0
  Filled 2024-09-18: qty 90, 90d supply, fill #1

## 2023-10-07 MED ORDER — ESTRADIOL 0.5 MG PO TABS
0.5000 mg | ORAL_TABLET | Freq: Every day | ORAL | 3 refills | Status: AC
  Filled 2023-10-07 – 2023-10-23 (×2): qty 90, 90d supply, fill #0
  Filled 2024-01-24: qty 90, 90d supply, fill #1
  Filled 2024-05-18: qty 90, 90d supply, fill #2
  Filled 2024-05-22: qty 90, 90d supply, fill #0
  Filled 2024-09-18: qty 90, 90d supply, fill #1

## 2023-10-08 ENCOUNTER — Other Ambulatory Visit (HOSPITAL_COMMUNITY): Payer: Self-pay

## 2023-10-08 MED ORDER — TESTOSTERONE CYPIONATE 200 MG/ML IM SOLN
INTRAMUSCULAR | 0 refills | Status: DC
  Filled 2023-10-08 – 2023-10-24 (×2): qty 3, 84d supply, fill #0
  Filled 2023-10-24: qty 3, 90d supply, fill #0

## 2023-10-09 ENCOUNTER — Other Ambulatory Visit (HOSPITAL_COMMUNITY): Payer: Self-pay

## 2023-10-17 ENCOUNTER — Other Ambulatory Visit (HOSPITAL_COMMUNITY): Payer: Self-pay

## 2023-10-18 ENCOUNTER — Other Ambulatory Visit (HOSPITAL_COMMUNITY): Payer: Self-pay

## 2023-10-23 ENCOUNTER — Other Ambulatory Visit (HOSPITAL_COMMUNITY): Payer: Self-pay

## 2023-10-24 ENCOUNTER — Other Ambulatory Visit (HOSPITAL_COMMUNITY): Payer: Self-pay

## 2023-11-01 DIAGNOSIS — Z13820 Encounter for screening for osteoporosis: Secondary | ICD-10-CM | POA: Diagnosis not present

## 2023-11-04 ENCOUNTER — Other Ambulatory Visit (HOSPITAL_COMMUNITY): Payer: Self-pay

## 2023-11-05 ENCOUNTER — Other Ambulatory Visit (HOSPITAL_COMMUNITY): Payer: Self-pay

## 2023-11-12 ENCOUNTER — Other Ambulatory Visit: Payer: Self-pay

## 2023-11-12 ENCOUNTER — Other Ambulatory Visit (HOSPITAL_COMMUNITY): Payer: Self-pay

## 2023-11-12 ENCOUNTER — Other Ambulatory Visit: Payer: Self-pay | Admitting: General Practice

## 2023-11-12 DIAGNOSIS — E782 Mixed hyperlipidemia: Secondary | ICD-10-CM

## 2023-11-12 DIAGNOSIS — I2510 Atherosclerotic heart disease of native coronary artery without angina pectoris: Secondary | ICD-10-CM

## 2023-11-12 MED ORDER — THYROID 90 MG PO TABS
90.0000 mg | ORAL_TABLET | Freq: Every day | ORAL | 3 refills | Status: AC
  Filled 2023-11-12 – 2024-01-02 (×4): qty 90, 90d supply, fill #0
  Filled 2024-04-08 (×3): qty 90, 90d supply, fill #1
  Filled 2024-04-25: qty 86, 86d supply, fill #1
  Filled 2024-04-25: qty 4, 4d supply, fill #1
  Filled 2024-08-05: qty 90, 90d supply, fill #2

## 2023-11-12 MED ORDER — REPATHA SURECLICK 140 MG/ML ~~LOC~~ SOAJ
140.0000 mg | SUBCUTANEOUS | 1 refills | Status: AC
  Filled 2023-11-12 – 2023-11-26 (×2): qty 6, 84d supply, fill #0
  Filled 2024-02-21: qty 6, 84d supply, fill #1

## 2023-11-12 MED ORDER — DIETHYLPROPION HCL ER 75 MG PO TB24
1.0000 | ORAL_TABLET | Freq: Every day | ORAL | 3 refills | Status: DC
  Filled 2023-11-12 – 2023-11-26 (×2): qty 30, 30d supply, fill #0
  Filled 2024-01-15: qty 30, 30d supply, fill #1
  Filled 2024-01-18: qty 30, 30d supply, fill #0
  Filled 2024-02-21: qty 30, 30d supply, fill #1
  Filled 2024-03-30 – 2024-04-25 (×2): qty 30, 30d supply, fill #2

## 2023-11-20 DIAGNOSIS — M7061 Trochanteric bursitis, right hip: Secondary | ICD-10-CM | POA: Diagnosis not present

## 2023-11-20 DIAGNOSIS — M25551 Pain in right hip: Secondary | ICD-10-CM | POA: Diagnosis not present

## 2023-11-22 ENCOUNTER — Other Ambulatory Visit (HOSPITAL_COMMUNITY): Payer: Self-pay

## 2023-11-26 ENCOUNTER — Other Ambulatory Visit (HOSPITAL_BASED_OUTPATIENT_CLINIC_OR_DEPARTMENT_OTHER): Payer: Self-pay | Admitting: Nurse Practitioner

## 2023-11-26 ENCOUNTER — Other Ambulatory Visit: Payer: Self-pay

## 2023-11-26 ENCOUNTER — Other Ambulatory Visit (HOSPITAL_COMMUNITY): Payer: Self-pay

## 2023-11-26 DIAGNOSIS — E785 Hyperlipidemia, unspecified: Secondary | ICD-10-CM

## 2023-11-30 ENCOUNTER — Other Ambulatory Visit (HOSPITAL_COMMUNITY): Payer: Self-pay

## 2023-12-01 NOTE — Progress Notes (Unsigned)
 Cardiology Office Note   Date:  12/01/2023   ID:  Kathleen Luna, DOB 1966-02-04, MRN [id number]  PCP:  No primary care provider on file.  Cardiologist:   Kiyah Demartini [place], MD   No chief complaint on file.     History of Present Illness: Kathleen Luna is a 58 y.o. female who presents for follow up chest pain. She has a hx of abnormal EKG with normal stress test, essential hypertension, and tobacco abuse.   She was seen  on 06/09/2014 for evaluation after she had an abnormal EKG and left arm pain.  Her EKG suggested old septal infarct.  Her risk factors included hypertension and tobacco abuse as well as family history for coronary artery disease.  Her left arm pain resolved.  She underwent stress testing on 05/19/2014.  Her nuclear stress test showed low risk with mild breast attenuation artifact.  She was not noted to have ST or T wave changes.   Noted intolerance of statins. Was referred to lipid clinic. Started on Repatha with improvement in LDL from 254 to 144. Zetia then added in November.  She was seen in the ED in August. She did have a chest CT in August showing aortic atherosclerosis and calcification as well as mild coronary calcification. No PE. Troponins were negative. No chest pain since then.   She is still smoking but trying to quit. Planning to start Chantix. Has a very strong family history of premature CAD.       Past Medical History:  Diagnosis Date   Arthropathy of lumbar facet joint 08/26/2018   Closed fracture of nasal bone with routine healing 05/26/2018   Headache(784.0)    otc med prn   HTN (hypertension) 05/11/2014   Hyperlipidemia    Hypothyroidism    Hypothyroidism, adult 06/02/2019   Primary ovarian failure 06/02/2019   Smoking 05/11/2014   Somatic dysfunction of right sacroiliac joint 08/11/2018   SVD (spontaneous vaginal delivery)    x 1   Tear of right acetabular labrum 12/03/2019   Vitamin D deficiency disease 06/02/2019    Past Surgical  History:  Procedure Laterality Date   BREAST SURGERY  09/2010   augmentation   VULVAR LESION REMOVAL  01/17/2012   Procedure: VULVAR LESION;  Surgeon: Leslie Andrea, MD;  Location: [facility];  Service: Gynecology;  Laterality: N/A;  incision and drainage  of perineal abscess   WISDOM TOOTH EXTRACTION       Current Outpatient Medications  Medication Sig Dispense Refill   Ascorbic Acid (VITAMIN C) 1000 MG tablet Take 1,000 mg by mouth daily. (Patient not taking: Reported on 05/01/2023)     benzonatate (TESSALON) 100 MG capsule Take 1-2 capsules (100-200 mg total) by mouth 3 (three) times daily as needed. (Patient not taking: Reported on 01/18/2023) 30 capsule 0   Cholecalciferol (VITAMIN D-3) 125 MCG (5000 UT) TABS Take 2 tablets by mouth daily. (Patient not taking: Reported on 05/01/2023)     cyclobenzaprine (FLEXERIL) 10 MG tablet Take 1 tablet (10 mg total) by mouth 3 (three) times daily. (Patient not taking: Reported on 05/01/2023) 30 tablet 0   Diethylpropion HCl CR 75 MG TB24 Take 1 tablet (75 mg total) by mouth daily. 30 tablet 3   estradiol (ESTRACE) 0.5 MG tablet Take 2 tablets (1 mg total) by mouth daily. (Patient not taking: Reported on 05/01/2023) 180 tablet 3   estradiol (ESTRACE) 0.5 MG tablet Take 1 tablet (0.5 mg total) by mouth daily along  with 1 mg tablet. 90 tablet 3   estradiol (ESTRACE) 1 MG tablet Take 1 tablet (1 mg total) by mouth daily along with 0.5 mg tablet. 90 tablet 3   estradiol (ESTRACE) 2 MG tablet Take 1 tablet (2 mg total) by mouth daily. 90 tablet 3   Evolocumab (REPATHA SURECLICK) 140 MG/ML SOAJ Inject 140 mg into the skin every 14 (fourteen) days. 6 mL 1   ezetimibe (ZETIA) 10 MG tablet Take 1 tablet (10 mg total) by mouth daily. 90 tablet 2   fluconazole (DIFLUCAN) 150 MG tablet Take 1 tablet (150 mg total) by mouth as directed. Repeat in 3 days as needed 2 tablet 0   fluocinonide cream (LIDEX) 0.05 % Apply on affected area once a day for 1-2 weeks (Patient not  taking: Reported on 05/01/2023) 60 g 0   fluticasone (FLONASE) 50 MCG/ACT nasal spray Place 2 sprays into both nostrils daily. (Patient not taking: Reported on 05/01/2023) 16 g 0   gabapentin (NEURONTIN) 300 MG capsule Take 1 capsule (300 mg total) by mouth at bedtime. 30 capsule 0   methocarbamol (ROBAXIN) 500 MG tablet Take 1 tablet by mouth every 6-8 hours as needed for spasm for 10 days. (Patient not taking: Reported on 01/18/2023) 40 tablet 0   NONFORMULARY OR COMPOUNDED ITEM 10mg  Testosterone Cypoinate for intramuscular injection. Use two times per week. Compounded. 3 months supply. (Patient not taking: Reported on 01/18/2023) 1 each 1   NP THYROID 120 MG tablet TAKE 1 TABLET (120 MG TOTAL) BY MOUTH DAILY BEFORE BREAKFAST. (Patient not taking: Reported on 05/01/2023) 90 tablet 3   ondansetron (ZOFRAN) 8 MG tablet Take 1 tablet by mouth every 8 hours as needed for nausea for 5 days. (Patient not taking: Reported on 01/18/2023) 15 tablet 0   oxyCODONE (OXY IR/ROXICODONE) 5 MG immediate release tablet Take 1 tablet every 4-6 hours by oral route as needed for pain for 7 days. (Patient not taking: Reported on 01/18/2023) 42 tablet 0   progesterone (PROMETRIUM) 200 MG capsule TAKE 2 CAPSULES (400 MG TOTAL) BY MOUTH DAILY. 180 capsule 3   progesterone (PROMETRIUM) 200 MG capsule Take 2 capsules (400 mg total) by mouth at bedtime. 180 capsule 0   progesterone (PROMETRIUM) 200 MG capsule Take 2 capsules (400 mg total) by mouth at bedtime. 180 capsule 3   testosterone cypionate (DEPOTESTOSTERONE CYPIONATE) 200 MG/ML injection Inject 0.5 mLs (100 mg total) into the muscle every 4 weeks for 3 months. Discard remainder in vial. (Patient not taking: Reported on 05/01/2023) 3 mL 0   testosterone cypionate (DEPOTESTOSTERONE CYPIONATE) 200 MG/ML injection Inject 0.5 mLs (100 mg total) into the muscle (Discard remainder in vial). Use  every 4 weeks for 3 months. 3 mL 0   TESTOSTERONE CYPIONATE IM Inject 10 mg into the muscle 2  (two) times a week. COMPOUNDED     thyroid (ARMOUR) 90 MG tablet Take 1 tablet (90 mg total) by mouth daily. 90 tablet 3   thyroid (NP THYROID) 90 MG tablet Take 1 tablet (90 mg total) by mouth daily before breakfast. 90 tablet 3   Varenicline Tartrate, Starter, 0.5 MG X 11 & 1 MG X 42 TBPK Take as directed on package (Patient not taking: Reported on 05/01/2023) 53 each 0   No current facility-administered medications for this visit.    Allergies:   Crestor [rosuvastatin], Lipitor [atorvastatin], Oxycodone, and Hydrocodone    Social History:  The patient  reports that she quit smoking about 2 years ago.  Her smoking use included cigarettes. She started smoking about 17 years ago. She has a 7.5 pack-year smoking history. She has never used smokeless tobacco. She reports current alcohol use of about 1.0 standard drink of alcohol per week. She reports that she does not use drugs.   Family History:  The patient's family history includes Cancer in her brother and sister; Dementia in her mother; Early death in her father; Heart attack in her father; Heart disease in her sister; Hyperlipidemia in her daughter and mother; Hypertension in her mother.    ROS:  Please see the history of present illness.   Otherwise, review of systems are positive for none.   All other systems are reviewed and negative.    PHYSICAL EXAM: VS:  There were no vitals taken for this visit. , BMI There is no height or weight on file to calculate BMI. GEN: Well nourished, well developed, in no acute distress HEENT: normal Neck: no JVD, carotid bruits, or masses Cardiac: RRR; no murmurs, rubs, or gallops,no edema  Respiratory:  clear to auscultation bilaterally, normal work of breathing GI: soft, nontender, nondistended, + BS MS: no deformity or atrophy Skin: warm and dry, no rash Neuro:  Strength and sensation are intact Psych: euthymic mood, full affect   EKG:  EKG is not ordered today. The ekg ordered today  demonstrates N/A   Recent Labs: 05/01/2023: ALT 17; BUN 12; Creatinine, Ser 0.55; Hemoglobin 11.9; Platelets 301; Potassium 4.9; Sodium 140; TSH <0.005    Lipid Panel    Component Value Date/Time   CHOL 169 05/01/2023 0839   TRIG 86 05/01/2023 0839   HDL 41 05/01/2023 0839   CHOLHDL 4.1 05/01/2023 0839   CHOLHDL 6.3 (H) 01/25/2021 1330   LDLCALC 112 (H) 05/01/2023 0839   LDLCALC 254 (H) 01/25/2021 1330      Wt Readings from Last 3 Encounters:  05/01/23 143 lb 12.8 oz (65.2 kg)  01/18/23 147 lb 9.6 oz (67 kg)  09/15/21 141 lb (64 kg)      Other studies Reviewed: Additional studies/ records that were reviewed today include:   Nuclear stress test 05/19/2014 Indication for Stress Test: Evaluation for Ischemia and Abnormal EKG  History: no prior nuclear MPI  Cardiac Risk Factors: Family History - CAD, Hypertension, Lipids and Smoker  Symptoms: SOB and Left arm pain  Nuclear Pre-Procedure  Caffeine/Decaff Intake: 7:00pm  NPO After: 5:00am   IV Site: R Antecubital  IV 0.9% NS with Angio Cath: 22g   Chest Size (in): n/a  IV Started by: Koren Shiver, CNMT   Height: 5\' 4"  (1.626 m)  Cup Size: 36D Bil Implants   BMI: Body mass index is 23.68 kg/(m^2).  Weight: 138 lb (62.596 kg)     Tech Comments: Bilateral breast implants   Nuclear Med Study  1 or 2 day study: 1 day  Stress Test Type: Stress   Order Authorizing Provider: Arthi Mcdonald [place], MD     Resting Radionuclide: Technetium 71m Sestamibi  Resting Radionuclide Dose: 10.3 mCi   Stress Radionuclide: Technetium 67m Sestamibi  Stress Radionuclide Dose: 29.8 mCi   Stress Protocol  Rest HR: 74  Stress HR:148   Rest BP: 150/99  Stress BP: 177/98   Exercise Time (min): 10:02  METS: 10.80   Predicted Max HR: 170 bpm  % Max HR: 87.06 bpm  Rate Pressure Product: 16109  Dose of Adenosine (mg): n/a  Dose of Lexiscan: n/a mg   Dose of Atropine (mg): n/a  Dose of Dobutamine:  n/a mcg/kg/min (at max HR)   Stress Test Technologist: Ernestene Mention, CCT  Nuclear Technologist: Gonzella Lex, CNMT   Rest Procedure: Myocardial perfusion imaging was performed at rest 45 minutes following the intravenous administration of Technetium 68m Sestamibi.  Stress Procedure: The patient performed treadmill exercise using a Bruce Protocol for 10 minutes 2 seconds. The patient stopped due to extreme head pain and denied any chest pain. There were no significant ST-T wave changes. Technetium 56m Sestamibi was injected IV at peak exercise and myocardial perfusion imaging was performed after a brief delay.  Transient Ischemic Dilatation (Normal <1.22): 1.01  QGS EDV: 85 ml  QGS ESV: 30 ml  LV Ejection Fraction: 64%  Rest ECG: NSR - Normal EKG  Stress ECG: No significant change from baseline ECG  QPS  Raw Data Images: There is a breast shadow that accounts for the anterior attenuation.  Stress Images: Normal homogeneous uptake in all areas of the myocardium.  Rest Images: Comparison with the stress images reveals no significant change.  Subtraction (SDS): No evidence of ischemia.  LV Wall Motion: NL LV Function; NL Wall Motion  Impression  Exercise Capacity: Good exercise capacity.  BP Response: Hypertensive blood pressure response.  Clinical Symptoms: No significant symptoms noted.  ECG Impression: No significant ST segment change suggestive of ischemia.  Comparison with Prior Nuclear Study: No previous nuclear study performed  Overall Impression: Low risk stress nuclear study with mild breast attenuation artifact.Thurmon Fair, MD  05/19/2014 10:45 AM     ASSESSMENT AND PLAN:  1.  CAD- mild coronary calcification on CT. No active chest pain. Focus on risk factor modification with lipid lowering and smoking cessation. Follow up in one year.    2. Familial Hyperlipidemia-01/25/2021: Cholesterol 334; HDL 53; LDL Cholesterol (Calc) 254; Triglycerides 123. Intolerant of statins.  Now on Repatha and Zetia.  Follow up lab in one  month. Followed by lipid clinic.  Heart healthy low-sodium high-fiber diet Regular aerobic activity.   3. Essential hypertension- Well-controlled  Continue to monitor, blood pressure log Heart healthy low-sodium diet-salty 6 given Increase physical activity as tolerated  4. Tobacco abuse. Counseled on importance of cessation.   Current medicines are reviewed at length with the patient today.  The patient does not have concerns regarding medicines.  The following changes have been made:  no change  Labs/ tests ordered today include:  No orders of the defined types were placed in this encounter.        Disposition:   FU with me in 1 year  Signed, Kyheem Bathgate [place], MD  12/01/2023 11:56 AM    [facility] [address], [place], [place], [address] Phone [phone], Fax [phone]

## 2023-12-05 ENCOUNTER — Encounter: Payer: Self-pay | Admitting: Gastroenterology

## 2023-12-06 ENCOUNTER — Ambulatory Visit: Payer: 59 | Attending: Cardiology | Admitting: Cardiology

## 2023-12-06 ENCOUNTER — Other Ambulatory Visit (HOSPITAL_COMMUNITY): Payer: Self-pay

## 2023-12-06 ENCOUNTER — Encounter: Payer: Self-pay | Admitting: Cardiology

## 2023-12-06 VITALS — BP 124/84 | HR 105 | Ht 64.0 in | Wt 139.0 lb

## 2023-12-06 DIAGNOSIS — E039 Hypothyroidism, unspecified: Secondary | ICD-10-CM

## 2023-12-06 DIAGNOSIS — Z720 Tobacco use: Secondary | ICD-10-CM | POA: Diagnosis not present

## 2023-12-06 DIAGNOSIS — I2510 Atherosclerotic heart disease of native coronary artery without angina pectoris: Secondary | ICD-10-CM | POA: Diagnosis not present

## 2023-12-06 DIAGNOSIS — E782 Mixed hyperlipidemia: Secondary | ICD-10-CM | POA: Diagnosis not present

## 2023-12-06 NOTE — Patient Instructions (Signed)
 Medication Instructions:  Continue same medications *If you need a refill on your cardiac medications before your next appointment, please call your pharmacy*  Lab Work: Bmet,lipid and hepatic panels,tsh,free t4 today  Testing/Procedures: None ordered  Follow-Up: At [facility], you and your health needs are our priority.  As part of our continuing mission to provide you with exceptional heart care, our providers are all part of one team.  This team includes your primary Cardiologist (physician) and Advanced Practice Providers or APPs (Physician Assistants and Nurse Practitioners) who all work together to provide you with the care you need, when you need it.  Your next appointment:  1 year    Call in Dec to schedule April appointment     Provider:  Dr.Jordan   We recommend signing up for the patient portal called "MyChart".  Sign up information is provided on this After Visit Summary.  MyChart is used to connect with patients for Virtual Visits (Telemedicine).  Patients are able to view lab/test results, encounter notes, upcoming appointments, etc.  Non-urgent messages can be sent to your provider as well.   To learn more about what you can do with MyChart, go to [email or web address].         1st Floor: - Lobby - Registration  - Pharmacy  - Lab - Cafe  2nd Floor: - PV Lab - Diagnostic Testing (echo, CT, nuclear med)  3rd Floor: - Vacant  4th Floor: - TCTS (cardiothoracic surgery) - AFib Clinic - Structural Heart Clinic - Vascular Surgery  - Vascular Ultrasound  5th Floor: - HeartCare Cardiology (general and EP) - Clinical Pharmacy for coumadin, hypertension, lipid, weight-loss medications, and med management appointments    Valet parking services will be available as well.

## 2023-12-07 LAB — LIPID PANEL
Chol/HDL Ratio: 3.5 ratio (ref 0.0–4.4)
Cholesterol, Total: 186 mg/dL (ref 100–199)
HDL: 53 mg/dL (ref >39)
LDL Chol Calc (NIH): 118 mg/dL — ABNORMAL HIGH (ref 0–99)
Triglycerides: 84 mg/dL (ref 0–149)
VLDL Cholesterol Cal: 15 mg/dL (ref 5–40)

## 2023-12-07 LAB — BASIC METABOLIC PANEL WITH GFR
BUN/Creatinine Ratio: 25 — ABNORMAL HIGH (ref 9–23)
BUN: 14 mg/dL (ref 6–24)
CO2: 22 mmol/L (ref 20–29)
Calcium: 10 mg/dL (ref 8.7–10.2)
Chloride: 98 mmol/L (ref 96–106)
Creatinine, Ser: 0.56 mg/dL — ABNORMAL LOW (ref 0.57–1.00)
Glucose: 77 mg/dL (ref 70–99)
Potassium: 4.6 mmol/L (ref 3.5–5.2)
Sodium: 135 mmol/L (ref 134–144)
eGFR: 105 mL/min/{1.73_m2} (ref >59)

## 2023-12-07 LAB — HEPATIC FUNCTION PANEL
ALT: 13 IU/L (ref 0–32)
AST: 17 IU/L (ref 0–40)
Albumin: 4.3 g/dL (ref 3.8–4.9)
Alkaline Phosphatase: 104 IU/L (ref 44–121)
Bilirubin Total: 0.4 mg/dL (ref 0.0–1.2)
Bilirubin, Direct: 0.13 mg/dL (ref 0.00–0.40)
Total Protein: 7 g/dL (ref 6.0–8.5)

## 2023-12-07 LAB — TSH+FREE T4
Free T4: 1.35 ng/dL (ref 0.82–1.77)
TSH: <0.005 u[IU]/mL — ABNORMAL LOW (ref 0.450–4.500)

## 2023-12-20 ENCOUNTER — Other Ambulatory Visit (HOSPITAL_BASED_OUTPATIENT_CLINIC_OR_DEPARTMENT_OTHER): Payer: Self-pay

## 2023-12-21 ENCOUNTER — Other Ambulatory Visit (HOSPITAL_COMMUNITY): Payer: Self-pay

## 2023-12-23 ENCOUNTER — Other Ambulatory Visit (HOSPITAL_BASED_OUTPATIENT_CLINIC_OR_DEPARTMENT_OTHER): Payer: Self-pay

## 2023-12-30 ENCOUNTER — Other Ambulatory Visit (HOSPITAL_BASED_OUTPATIENT_CLINIC_OR_DEPARTMENT_OTHER): Payer: Self-pay

## 2024-01-02 ENCOUNTER — Other Ambulatory Visit (HOSPITAL_BASED_OUTPATIENT_CLINIC_OR_DEPARTMENT_OTHER): Payer: Self-pay

## 2024-01-15 ENCOUNTER — Other Ambulatory Visit (HOSPITAL_COMMUNITY): Payer: Self-pay

## 2024-01-15 ENCOUNTER — Other Ambulatory Visit: Payer: Self-pay

## 2024-01-17 ENCOUNTER — Other Ambulatory Visit (HOSPITAL_COMMUNITY): Payer: Self-pay

## 2024-01-18 ENCOUNTER — Other Ambulatory Visit (HOSPITAL_BASED_OUTPATIENT_CLINIC_OR_DEPARTMENT_OTHER): Payer: Self-pay

## 2024-01-21 ENCOUNTER — Other Ambulatory Visit (HOSPITAL_COMMUNITY): Payer: Self-pay

## 2024-01-24 ENCOUNTER — Other Ambulatory Visit (HOSPITAL_BASED_OUTPATIENT_CLINIC_OR_DEPARTMENT_OTHER): Payer: Self-pay

## 2024-01-24 ENCOUNTER — Other Ambulatory Visit: Payer: Self-pay

## 2024-01-24 MED ORDER — PROGESTERONE 200 MG PO CAPS
400.0000 mg | ORAL_CAPSULE | Freq: Every day | ORAL | 0 refills | Status: DC
  Filled 2024-01-24: qty 180, 90d supply, fill #0

## 2024-01-25 ENCOUNTER — Other Ambulatory Visit (HOSPITAL_BASED_OUTPATIENT_CLINIC_OR_DEPARTMENT_OTHER): Payer: Self-pay

## 2024-02-05 DIAGNOSIS — M79644 Pain in right finger(s): Secondary | ICD-10-CM | POA: Diagnosis not present

## 2024-02-05 DIAGNOSIS — M65839 Other synovitis and tenosynovitis, unspecified forearm: Secondary | ICD-10-CM | POA: Diagnosis not present

## 2024-02-05 DIAGNOSIS — D1722 Benign lipomatous neoplasm of skin and subcutaneous tissue of left arm: Secondary | ICD-10-CM | POA: Diagnosis not present

## 2024-02-05 DIAGNOSIS — M7542 Impingement syndrome of left shoulder: Secondary | ICD-10-CM | POA: Diagnosis not present

## 2024-02-05 DIAGNOSIS — M1811 Unilateral primary osteoarthritis of first carpometacarpal joint, right hand: Secondary | ICD-10-CM | POA: Diagnosis not present

## 2024-02-06 ENCOUNTER — Other Ambulatory Visit: Payer: Self-pay

## 2024-02-21 ENCOUNTER — Other Ambulatory Visit: Payer: Self-pay

## 2024-02-21 ENCOUNTER — Encounter: Payer: Self-pay | Admitting: Gastroenterology

## 2024-02-21 ENCOUNTER — Other Ambulatory Visit (HOSPITAL_BASED_OUTPATIENT_CLINIC_OR_DEPARTMENT_OTHER): Payer: Self-pay

## 2024-02-21 ENCOUNTER — Ambulatory Visit (AMBULATORY_SURGERY_CENTER)

## 2024-02-21 VITALS — Ht 64.0 in | Wt 138.0 lb

## 2024-02-21 DIAGNOSIS — Z860100 Personal history of colon polyps, unspecified: Secondary | ICD-10-CM

## 2024-02-21 MED ORDER — NA SULFATE-K SULFATE-MG SULF 17.5-3.13-1.6 GM/177ML PO SOLN
1.0000 | Freq: Once | ORAL | 0 refills | Status: AC
Start: 2024-02-21 — End: 2024-02-28
  Filled 2024-02-21: qty 354, 1d supply, fill #0

## 2024-02-21 NOTE — Progress Notes (Signed)

## 2024-03-06 ENCOUNTER — Ambulatory Visit (AMBULATORY_SURGERY_CENTER): Admitting: Gastroenterology

## 2024-03-06 ENCOUNTER — Encounter: Payer: Self-pay | Admitting: Gastroenterology

## 2024-03-06 VITALS — BP 115/79 | HR 71 | Temp 98.3°F | Resp 16 | Ht 64.0 in | Wt 138.0 lb

## 2024-03-06 DIAGNOSIS — Z860100 Personal history of colon polyps, unspecified: Secondary | ICD-10-CM | POA: Diagnosis not present

## 2024-03-06 DIAGNOSIS — K640 First degree hemorrhoids: Secondary | ICD-10-CM | POA: Diagnosis not present

## 2024-03-06 DIAGNOSIS — Z1211 Encounter for screening for malignant neoplasm of colon: Secondary | ICD-10-CM | POA: Diagnosis not present

## 2024-03-06 DIAGNOSIS — E039 Hypothyroidism, unspecified: Secondary | ICD-10-CM | POA: Diagnosis not present

## 2024-03-06 DIAGNOSIS — D124 Benign neoplasm of descending colon: Secondary | ICD-10-CM | POA: Diagnosis not present

## 2024-03-06 DIAGNOSIS — K5730 Diverticulosis of large intestine without perforation or abscess without bleeding: Secondary | ICD-10-CM | POA: Diagnosis not present

## 2024-03-06 DIAGNOSIS — E785 Hyperlipidemia, unspecified: Secondary | ICD-10-CM | POA: Diagnosis not present

## 2024-03-06 MED ORDER — SODIUM CHLORIDE 0.9 % IV SOLN: 500.0000 mL | Freq: Once | INTRAVENOUS | Status: DC

## 2024-03-06 NOTE — Progress Notes (Signed)
To pacu, VSS. Report to Rn,tb

## 2024-03-06 NOTE — Progress Notes (Signed)
 [facility] History and Physical   Primary Care Physician:  Patient, No Pcp Per   Reason for Procedure:   Colon cancer screening  Plan:    Screening colonoscopy     HPI: Kathleen Luna is a 58 y.o. female undergoing average risk screening colonoscopy.  She has no family history of colon cancer and no chronic GI symptoms.  She had a colonoscopy in 2013 in which 3 polyps were removed which were benign lymphoid aggregates.   Past Medical History:  Diagnosis Date   Arthropathy of lumbar facet joint 08/26/2018   Closed fracture of nasal bone with routine healing 05/26/2018   Headache(784.0)    otc med prn   HTN (hypertension) 05/11/2014   Hyperlipidemia    Hypothyroidism    Hypothyroidism, adult 06/02/2019   Primary ovarian failure 06/02/2019   Smoking 05/11/2014   Somatic dysfunction of right sacroiliac joint 08/11/2018   SVD (spontaneous vaginal delivery)    x 1   Tear of right acetabular labrum 12/03/2019   Vitamin D  deficiency disease 06/02/2019    Past Surgical History:  Procedure Laterality Date   BREAST SURGERY  09/2010   augmentation   VULVAR LESION REMOVAL  01/17/2012   Procedure: VULVAR LESION;  Surgeon: Lynwood FORBES Curlene PONCE, MD;  Location: [facility];  Service: Gynecology;  Laterality: N/A;  incision and drainage  of perineal abscess   WISDOM TOOTH EXTRACTION      Prior to Admission medications   Medication Sig Start Date End Date Taking? Authorizing Provider  Bacillus Coagulans-Inulin (ALIGN PREBIOTIC-PROBIOTIC PO) Take 1 Capful by mouth daily.   Yes [provider]  Diethylpropion  HCl CR 75 MG TB24 Take 1 tablet (75 mg total) by mouth daily. 11/12/23  Yes   estradiol  (ESTRACE ) 0.5 MG tablet Take 1 tablet (0.5 mg total) by mouth daily along with 1 mg tablet. 10/07/23  Yes   estradiol  (ESTRACE ) 1 MG tablet Take 1 tablet (1 mg total) by mouth daily along with 0.5 mg tablet. 10/07/23  Yes   ezetimibe  (ZETIA ) 10 MG tablet Take 1 tablet (10 mg total) by mouth  daily. 08/12/23  Yes [place], Peter M, MD  progesterone  (PROMETRIUM ) 200 MG capsule Take 2 capsules (400 mg total) by mouth at bedtime. 01/24/24  Yes Mat Browning, MD  thyroid  (ARMOUR) 90 MG tablet Take 1 tablet (90 mg total) by mouth daily. 11/12/23  Yes   Ascorbic Acid (VITAMIN C) 1000 MG tablet Take 1,000 mg by mouth daily. Patient not taking: No sig reported    [provider]  Evolocumab  (REPATHA  SURECLICK) 140 MG/ML SOAJ Inject 140 mg into the skin every 14 (fourteen) days. 11/12/23   Emelia Josefa HERO, NP  testosterone  cypionate (DEPOTESTOSTERONE CYPIONATE) 200 MG/ML injection Inject 0.5 mLs (100 mg total) into the muscle every 4 weeks for 3 months. Discard remainder in vial. 10/26/22       Current Outpatient Medications  Medication Sig Dispense Refill   Bacillus Coagulans-Inulin (ALIGN PREBIOTIC-PROBIOTIC PO) Take 1 Capful by mouth daily.     Diethylpropion  HCl CR 75 MG TB24 Take 1 tablet (75 mg total) by mouth daily. 30 tablet 3   estradiol  (ESTRACE ) 0.5 MG tablet Take 1 tablet (0.5 mg total) by mouth daily along with 1 mg tablet. 90 tablet 3   estradiol  (ESTRACE ) 1 MG tablet Take 1 tablet (1 mg total) by mouth daily along with 0.5 mg tablet. 90 tablet 3   ezetimibe  (ZETIA ) 10 MG tablet Take 1 tablet (10 mg total)  by mouth daily. 90 tablet 2   progesterone  (PROMETRIUM ) 200 MG capsule Take 2 capsules (400 mg total) by mouth at bedtime. 180 capsule 0   thyroid  (ARMOUR) 90 MG tablet Take 1 tablet (90 mg total) by mouth daily. 90 tablet 3   Ascorbic Acid (VITAMIN C) 1000 MG tablet Take 1,000 mg by mouth daily. (Patient not taking: No sig reported)     Evolocumab  (REPATHA  SURECLICK) 140 MG/ML SOAJ Inject 140 mg into the skin every 14 (fourteen) days. 6 mL 1   testosterone  cypionate (DEPOTESTOSTERONE CYPIONATE) 200 MG/ML injection Inject 0.5 mLs (100 mg total) into the muscle every 4 weeks for 3 months. Discard remainder in vial. 3 mL 0   Current Facility-Administered Medications   Medication Dose Route Frequency Provider Last Rate Last Admin   0.9 %  sodium chloride  infusion  500 mL Intravenous Once Stacia Glendia BRAVO, MD        Allergies as of 03/06/2024 - Review Complete 03/06/2024  Allergen Reaction Noted   Crestor [rosuvastatin] Other (See Comments) 05/09/2021   Lipitor [atorvastatin] Other (See Comments) 05/09/2021   Codeine  Itching 12/06/2023   Hydrocodone Itching 01/17/2012   Oxycodone  Itching 01/18/2023    Family History  Problem Relation Age of Onset   Hyperlipidemia Mother    Hypertension Mother    Dementia Mother    Early death Father    Heart attack Father    Cancer Sister    Heart disease Sister    Cancer Brother    Hyperlipidemia Daughter    Colon cancer Neg Hx    Rectal cancer Neg Hx    Stomach cancer Neg Hx    Esophageal cancer Neg Hx     Social History   Socioeconomic History   Marital status: Single    Spouse name: Not on file   Number of children: Not on file   Years of education: Not on file   Highest education level: Not on file  Occupational History   Not on file  Tobacco Use   Smoking status: Every Day    Current packs/day: 0.00    Average packs/day: 0.5 packs/day for 15.0 years (7.5 ttl pk-yrs)    Types: Cigarettes    Start date: 04/17/2006    Last attempt to quit: 04/17/2021    Years since quitting: 2.8   Smokeless tobacco: Never   Tobacco comments:    12/06/2023 Patient smokes about 10 cigarettes daily  Vaping Use   Vaping status: Former  Substance and Sexual Activity   Alcohol use: Yes    Alcohol/week: 1.0 standard drink of alcohol    Types: 1 Glasses of wine per week    Comment: weekends   Drug use: No   Sexual activity: Yes    Birth control/protection: I.U.D.  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   Social Drivers of Corporate investment banker Strain: Not on file   Food Insecurity: Not on file  Transportation Needs: Not on file  Physical Activity: Not on file  Stress: Not on file  Social Connections: Not on file  Intimate Partner Violence: Not on file    Review of Systems:  All other review of systems negative except as mentioned in the HPI.  Physical Exam: Vital signs BP 120/71   Pulse 96   Temp 98.3 F (36.8 C)   Ht 5' 4 (1.626 m)  Wt 138 lb (62.6 kg)   SpO2 100%   BMI 23.69 kg/m   General:   Alert,  Well-developed, well-nourished, pleasant and cooperative in NAD Airway:  Mallampati 1 Lungs:  Clear throughout to auscultation.   Heart:  Regular rate and rhythm; no murmurs, clicks, rubs,  or gallops. Abdomen:  Soft, nontender and nondistended. Normal bowel sounds.   Neuro/Psych:  Normal mood and affect. A and O x 3   Koleman Marling E. Stacia, MD [facility]

## 2024-03-06 NOTE — Progress Notes (Signed)
 Called to room to assist during endoscopic procedure.  Patient ID and intended procedure confirmed with present staff. Received instructions for my participation in the procedure from the performing physician.

## 2024-03-06 NOTE — Progress Notes (Signed)
 Pt's states no medical or surgical changes since previsit or office visit.

## 2024-03-06 NOTE — Op Note (Signed)
 [facility] Patient Name: Kathleen Luna Procedure Date: 03/06/2024 9:17 AM MRN: [id number] Endoscopist: Glendia E. Stacia , MD, [id number] Age: 58 Referring MD:  Date of Birth: [birth year] Gender: Female Account #: [id number] Procedure:                Colonoscopy Indications:              Screening for colorectal malignant neoplasm (last                            colonoscopy was more than 10 years ago) Medicines:                Monitored Anesthesia Care Procedure:                Pre-Anesthesia Assessment:                           - Prior to the procedure, a History and Physical                            was performed, and patient medications and                            allergies were reviewed. The patient's tolerance of                            previous anesthesia was also reviewed. The risks                            and benefits of the procedure and the sedation                            options and risks were discussed with the patient.                            All questions were answered, and informed consent                            was obtained. Prior Anticoagulants: The patient has                            taken no anticoagulant or antiplatelet agents. ASA                            Grade Assessment: II - A patient with mild systemic                            disease. After reviewing the risks and benefits,                            the patient was deemed in satisfactory condition to                            undergo the procedure.  After obtaining informed consent, the colonoscope                            was passed under direct vision. Throughout the                            procedure, the patient's blood pressure, pulse, and                            oxygen  saturations were monitored continuously. The                            PCF-HQ190L Colonoscope [id number] was introduced                            through the anus  and advanced to the the terminal                            ileum, with identification of the appendiceal                            orifice and IC valve. The colonoscopy was performed                            without difficulty. The patient tolerated the                            procedure well. The quality of the bowel                            preparation was good. The terminal ileum, ileocecal                            valve, appendiceal orifice, and rectum were                            photographed. The bowel preparation used was SUPREP                            via split dose instruction. Scope In: 9:28:12 AM Scope Out: 9:50:30 AM Scope Withdrawal Time: 0 hours 15 minutes 23 seconds  Total Procedure Duration: 0 hours 22 minutes 18 seconds  Findings:                 The perianal and digital rectal examinations were                            normal. Pertinent negatives include normal                            sphincter tone and no palpable rectal lesions.                           A 2 mm polyp was found in the descending colon. The  polyp was sessile. The polyp was removed with a                            cold snare. Resection and retrieval were complete.                            Estimated blood loss was minimal.                           Many large-mouthed and small-mouthed diverticula                            were found in the sigmoid colon, descending colon,                            transverse colon and ascending colon.                           The exam was otherwise normal throughout the                            examined colon.                           The terminal ileum appeared normal.                           Non-bleeding internal hemorrhoids were found during                            retroflexion. The hemorrhoids were Grade I                            (internal hemorrhoids that do not prolapse).                           No  additional abnormalities were found on                            retroflexion. Complications:            No immediate complications. Estimated Blood Loss:     Estimated blood loss was minimal. Impression:               - One 2 mm polyp in the descending colon, removed                            with a cold snare. Resected and retrieved.                           - Severe diverticulosis in the sigmoid colon, in                            the descending colon, in the transverse colon and  in the ascending colon.                           - The examined portion of the ileum was normal.                           - Non-bleeding internal hemorrhoids. Recommendation:           - Patient has a contact number available for                            emergencies. The signs and symptoms of potential                            delayed complications were discussed with the                            patient. Return to normal activities tomorrow.                            Written discharge instructions were provided to the                            patient.                           - Resume previous diet.                           - Continue present medications.                           - Await pathology results.                           - Repeat colonoscopy (date not yet determined) for                            surveillance based on pathology results.                           - Recommend high fiber diet/daily fiber supplement                            to reduce risk of diverticular complications Lakevia Perris E. Stacia, MD 03/06/2024 9:56:53 AM This report has been signed electronically.

## 2024-03-06 NOTE — Patient Instructions (Signed)

## 2024-03-09 ENCOUNTER — Telehealth: Payer: Self-pay

## 2024-03-09 NOTE — Telephone Encounter (Signed)
 Left message

## 2024-03-10 LAB — SURGICAL PATHOLOGY

## 2024-03-11 ENCOUNTER — Ambulatory Visit: Payer: Self-pay | Admitting: Gastroenterology

## 2024-03-11 NOTE — Progress Notes (Signed)
 Ms. Weimann,  The polyp which I removed during your recent procedure was proven to be completely benign but is considered a pre-cancerous polyp that MAY have grown into cancer if it had not been removed.  Studies shows that at least 20% of women over age 59 and 30% of men over age 81 have pre-cancerous polyps.  Based on current nationally recognized surveillance guidelines, I recommend that you have a repeat colonoscopy in 7 years.   If you develop any new rectal bleeding, abdominal pain or significant bowel habit changes, please contact me before then.

## 2024-03-30 ENCOUNTER — Other Ambulatory Visit (HOSPITAL_BASED_OUTPATIENT_CLINIC_OR_DEPARTMENT_OTHER): Payer: Self-pay

## 2024-03-30 ENCOUNTER — Other Ambulatory Visit: Payer: Self-pay

## 2024-03-31 ENCOUNTER — Other Ambulatory Visit (HOSPITAL_BASED_OUTPATIENT_CLINIC_OR_DEPARTMENT_OTHER): Payer: Self-pay

## 2024-03-31 ENCOUNTER — Other Ambulatory Visit: Payer: Self-pay

## 2024-03-31 MED ORDER — TESTOSTERONE CYPIONATE 200 MG/ML IM SOLN
100.0000 mg | INTRAMUSCULAR | 0 refills | Status: AC
  Filled 2024-03-31: qty 3, 84d supply, fill #0

## 2024-04-02 ENCOUNTER — Other Ambulatory Visit (HOSPITAL_BASED_OUTPATIENT_CLINIC_OR_DEPARTMENT_OTHER): Payer: Self-pay

## 2024-04-08 ENCOUNTER — Encounter: Payer: Self-pay | Admitting: Gastroenterology

## 2024-04-08 ENCOUNTER — Other Ambulatory Visit: Payer: Self-pay

## 2024-04-08 ENCOUNTER — Other Ambulatory Visit (HOSPITAL_BASED_OUTPATIENT_CLINIC_OR_DEPARTMENT_OTHER): Payer: Self-pay

## 2024-04-09 ENCOUNTER — Other Ambulatory Visit (HOSPITAL_BASED_OUTPATIENT_CLINIC_OR_DEPARTMENT_OTHER): Payer: Self-pay

## 2024-04-09 ENCOUNTER — Other Ambulatory Visit: Payer: Self-pay

## 2024-04-10 ENCOUNTER — Other Ambulatory Visit: Payer: Self-pay

## 2024-04-15 ENCOUNTER — Other Ambulatory Visit: Payer: Self-pay

## 2024-04-25 ENCOUNTER — Other Ambulatory Visit (HOSPITAL_BASED_OUTPATIENT_CLINIC_OR_DEPARTMENT_OTHER): Payer: Self-pay

## 2024-04-28 ENCOUNTER — Other Ambulatory Visit: Payer: Self-pay

## 2024-04-28 ENCOUNTER — Other Ambulatory Visit (HOSPITAL_BASED_OUTPATIENT_CLINIC_OR_DEPARTMENT_OTHER): Payer: Self-pay

## 2024-05-20 ENCOUNTER — Other Ambulatory Visit (HOSPITAL_BASED_OUTPATIENT_CLINIC_OR_DEPARTMENT_OTHER): Payer: Self-pay

## 2024-05-21 ENCOUNTER — Other Ambulatory Visit (HOSPITAL_COMMUNITY): Payer: Self-pay

## 2024-05-21 MED ORDER — PROGESTERONE 200 MG PO CAPS
400.0000 mg | ORAL_CAPSULE | Freq: Every evening | ORAL | 0 refills | Status: DC
  Filled 2024-05-21 – 2024-05-22 (×2): qty 180, 90d supply, fill #0

## 2024-05-22 ENCOUNTER — Other Ambulatory Visit (HOSPITAL_COMMUNITY): Payer: Self-pay

## 2024-05-22 ENCOUNTER — Other Ambulatory Visit (HOSPITAL_BASED_OUTPATIENT_CLINIC_OR_DEPARTMENT_OTHER): Payer: Self-pay

## 2024-06-12 ENCOUNTER — Other Ambulatory Visit (HOSPITAL_BASED_OUTPATIENT_CLINIC_OR_DEPARTMENT_OTHER): Payer: Self-pay

## 2024-06-20 ENCOUNTER — Other Ambulatory Visit (HOSPITAL_BASED_OUTPATIENT_CLINIC_OR_DEPARTMENT_OTHER): Payer: Self-pay

## 2024-06-22 ENCOUNTER — Other Ambulatory Visit: Payer: Self-pay

## 2024-06-22 ENCOUNTER — Other Ambulatory Visit (HOSPITAL_BASED_OUTPATIENT_CLINIC_OR_DEPARTMENT_OTHER): Payer: Self-pay

## 2024-06-22 MED ORDER — DIETHYLPROPION HCL ER 75 MG PO TB24
1.0000 | ORAL_TABLET | Freq: Every day | ORAL | 3 refills | Status: AC
  Filled 2024-06-22: qty 30, 30d supply, fill #0
  Filled 2024-07-30: qty 30, 30d supply, fill #1
  Filled 2024-09-18: qty 30, 30d supply, fill #2

## 2024-07-13 ENCOUNTER — Other Ambulatory Visit (HOSPITAL_BASED_OUTPATIENT_CLINIC_OR_DEPARTMENT_OTHER): Payer: Self-pay

## 2024-07-13 DIAGNOSIS — L728 Other follicular cysts of the skin and subcutaneous tissue: Secondary | ICD-10-CM | POA: Diagnosis not present

## 2024-07-13 MED ORDER — DOXYCYCLINE HYCLATE 100 MG PO CAPS
100.0000 mg | ORAL_CAPSULE | Freq: Two times a day (BID) | ORAL | 0 refills | Status: DC
  Filled 2024-07-13: qty 10, 5d supply, fill #0

## 2024-07-27 ENCOUNTER — Other Ambulatory Visit (HOSPITAL_BASED_OUTPATIENT_CLINIC_OR_DEPARTMENT_OTHER): Payer: Self-pay

## 2024-07-27 ENCOUNTER — Other Ambulatory Visit (HOSPITAL_COMMUNITY): Payer: Self-pay | Admitting: Orthopedic Surgery

## 2024-07-27 ENCOUNTER — Ambulatory Visit (HOSPITAL_COMMUNITY)
Admission: RE | Admit: 2024-07-27 | Discharge: 2024-07-27 | Disposition: A | Source: Ambulatory Visit | Attending: Orthopedic Surgery | Admitting: Orthopedic Surgery

## 2024-07-27 DIAGNOSIS — M19011 Primary osteoarthritis, right shoulder: Secondary | ICD-10-CM | POA: Diagnosis not present

## 2024-07-27 DIAGNOSIS — M25511 Pain in right shoulder: Secondary | ICD-10-CM

## 2024-07-27 DIAGNOSIS — D485 Neoplasm of uncertain behavior of skin: Secondary | ICD-10-CM | POA: Diagnosis not present

## 2024-07-27 DIAGNOSIS — M25551 Pain in right hip: Secondary | ICD-10-CM | POA: Diagnosis not present

## 2024-07-27 DIAGNOSIS — L821 Other seborrheic keratosis: Secondary | ICD-10-CM | POA: Diagnosis not present

## 2024-07-27 DIAGNOSIS — L281 Prurigo nodularis: Secondary | ICD-10-CM | POA: Diagnosis not present

## 2024-07-27 DIAGNOSIS — L814 Other melanin hyperpigmentation: Secondary | ICD-10-CM | POA: Diagnosis not present

## 2024-07-27 DIAGNOSIS — D1801 Hemangioma of skin and subcutaneous tissue: Secondary | ICD-10-CM | POA: Diagnosis not present

## 2024-07-27 DIAGNOSIS — L728 Other follicular cysts of the skin and subcutaneous tissue: Secondary | ICD-10-CM | POA: Diagnosis not present

## 2024-07-27 MED ORDER — CLOBETASOL PROPIONATE 0.05 % EX CREA
TOPICAL_CREAM | CUTANEOUS | 11 refills | Status: AC
  Filled 2024-07-27: qty 60, 30d supply, fill #0

## 2024-07-30 ENCOUNTER — Other Ambulatory Visit (HOSPITAL_COMMUNITY): Payer: Self-pay

## 2024-07-30 ENCOUNTER — Other Ambulatory Visit: Payer: Self-pay

## 2024-07-31 ENCOUNTER — Other Ambulatory Visit (HOSPITAL_COMMUNITY)

## 2024-08-05 ENCOUNTER — Other Ambulatory Visit (HOSPITAL_BASED_OUTPATIENT_CLINIC_OR_DEPARTMENT_OTHER): Payer: Self-pay

## 2024-08-05 ENCOUNTER — Other Ambulatory Visit: Payer: Self-pay

## 2024-08-06 ENCOUNTER — Other Ambulatory Visit (HOSPITAL_BASED_OUTPATIENT_CLINIC_OR_DEPARTMENT_OTHER): Payer: Self-pay

## 2024-08-11 ENCOUNTER — Other Ambulatory Visit (HOSPITAL_BASED_OUTPATIENT_CLINIC_OR_DEPARTMENT_OTHER): Payer: Self-pay

## 2024-08-17 DIAGNOSIS — L905 Scar conditions and fibrosis of skin: Secondary | ICD-10-CM | POA: Diagnosis not present

## 2024-09-02 ENCOUNTER — Other Ambulatory Visit (HOSPITAL_COMMUNITY): Payer: Self-pay

## 2024-09-10 ENCOUNTER — Other Ambulatory Visit (HOSPITAL_BASED_OUTPATIENT_CLINIC_OR_DEPARTMENT_OTHER): Payer: Self-pay

## 2024-09-10 ENCOUNTER — Telehealth: Admitting: Physician Assistant

## 2024-09-10 DIAGNOSIS — R3989 Other symptoms and signs involving the genitourinary system: Secondary | ICD-10-CM | POA: Diagnosis not present

## 2024-09-10 MED ORDER — CEPHALEXIN 500 MG PO CAPS
500.0000 mg | ORAL_CAPSULE | Freq: Two times a day (BID) | ORAL | 0 refills | Status: AC
  Filled 2024-09-10: qty 14, 7d supply, fill #0

## 2024-09-10 NOTE — Progress Notes (Signed)

## 2024-09-18 ENCOUNTER — Other Ambulatory Visit (HOSPITAL_BASED_OUTPATIENT_CLINIC_OR_DEPARTMENT_OTHER): Payer: Self-pay

## 2024-09-18 MED ORDER — PROGESTERONE 200 MG PO CAPS
400.0000 mg | ORAL_CAPSULE | Freq: Every day | ORAL | 0 refills | Status: AC
  Filled 2024-09-18: qty 180, 90d supply, fill #0
